# Patient Record
Sex: Female | Born: 1945 | Race: White | Hispanic: No | Marital: Married | Smoking: Current every day smoker
Health system: Southern US, Community
[De-identification: ages and names within clinical notes are randomized; demographics above are authoritative.]

## PROBLEM LIST (undated history)

## (undated) DIAGNOSIS — I10 Essential (primary) hypertension: Secondary | ICD-10-CM

## (undated) DIAGNOSIS — F0390 Unspecified dementia without behavioral disturbance: Secondary | ICD-10-CM

## (undated) DIAGNOSIS — E7800 Pure hypercholesterolemia, unspecified: Secondary | ICD-10-CM

## (undated) DIAGNOSIS — F32A Depression, unspecified: Secondary | ICD-10-CM

## (undated) DIAGNOSIS — F329 Major depressive disorder, single episode, unspecified: Secondary | ICD-10-CM

## (undated) DIAGNOSIS — I639 Cerebral infarction, unspecified: Secondary | ICD-10-CM

## (undated) DIAGNOSIS — K219 Gastro-esophageal reflux disease without esophagitis: Secondary | ICD-10-CM

---

## 2004-04-14 ENCOUNTER — Ambulatory Visit: Payer: Self-pay | Admitting: Unknown Physician Specialty

## 2004-05-06 ENCOUNTER — Ambulatory Visit: Payer: Self-pay | Admitting: Obstetrics and Gynecology

## 2004-08-17 ENCOUNTER — Inpatient Hospital Stay: Payer: Self-pay | Admitting: Urology

## 2004-08-18 ENCOUNTER — Other Ambulatory Visit: Payer: Self-pay

## 2004-08-24 ENCOUNTER — Ambulatory Visit: Payer: Self-pay | Admitting: Urology

## 2004-08-24 ENCOUNTER — Other Ambulatory Visit: Payer: Self-pay

## 2004-08-25 ENCOUNTER — Ambulatory Visit: Payer: Self-pay | Admitting: Urology

## 2006-02-19 ENCOUNTER — Emergency Department (HOSPITAL_COMMUNITY): Admission: EM | Admit: 2006-02-19 | Discharge: 2006-02-19 | Payer: Self-pay | Admitting: Emergency Medicine

## 2006-05-01 ENCOUNTER — Ambulatory Visit: Payer: Self-pay

## 2006-11-13 ENCOUNTER — Ambulatory Visit: Payer: Self-pay | Admitting: Internal Medicine

## 2006-11-30 ENCOUNTER — Ambulatory Visit: Payer: Self-pay | Admitting: Internal Medicine

## 2006-12-20 ENCOUNTER — Ambulatory Visit: Payer: Self-pay | Admitting: Internal Medicine

## 2007-01-04 ENCOUNTER — Emergency Department: Payer: Self-pay | Admitting: Unknown Physician Specialty

## 2007-01-06 ENCOUNTER — Emergency Department (HOSPITAL_COMMUNITY): Admission: EM | Admit: 2007-01-06 | Discharge: 2007-01-06 | Payer: Self-pay | Admitting: Emergency Medicine

## 2007-01-22 ENCOUNTER — Ambulatory Visit: Payer: Self-pay | Admitting: Internal Medicine

## 2007-07-05 ENCOUNTER — Ambulatory Visit: Payer: Self-pay | Admitting: Unknown Physician Specialty

## 2007-11-12 ENCOUNTER — Ambulatory Visit: Payer: Self-pay | Admitting: Internal Medicine

## 2007-12-24 ENCOUNTER — Ambulatory Visit: Payer: Self-pay | Admitting: Internal Medicine

## 2008-04-10 ENCOUNTER — Ambulatory Visit: Payer: Self-pay | Admitting: Internal Medicine

## 2008-05-02 ENCOUNTER — Emergency Department: Payer: Self-pay | Admitting: Internal Medicine

## 2008-05-02 ENCOUNTER — Emergency Department (HOSPITAL_COMMUNITY): Admission: EM | Admit: 2008-05-02 | Discharge: 2008-05-02 | Payer: Self-pay | Admitting: Emergency Medicine

## 2008-05-08 ENCOUNTER — Ambulatory Visit: Payer: Self-pay | Admitting: Internal Medicine

## 2008-05-12 ENCOUNTER — Ambulatory Visit: Payer: Self-pay | Admitting: Vascular Surgery

## 2008-05-21 ENCOUNTER — Encounter: Payer: Self-pay | Admitting: Internal Medicine

## 2008-05-28 ENCOUNTER — Inpatient Hospital Stay: Payer: Self-pay | Admitting: Vascular Surgery

## 2008-06-20 ENCOUNTER — Encounter: Payer: Self-pay | Admitting: Internal Medicine

## 2008-12-01 ENCOUNTER — Ambulatory Visit: Payer: Self-pay | Admitting: Nephrology

## 2009-02-12 ENCOUNTER — Emergency Department (HOSPITAL_COMMUNITY): Admission: EM | Admit: 2009-02-12 | Discharge: 2009-02-12 | Payer: Self-pay | Admitting: Emergency Medicine

## 2009-05-12 ENCOUNTER — Inpatient Hospital Stay: Payer: Self-pay | Admitting: Internal Medicine

## 2009-06-16 ENCOUNTER — Ambulatory Visit: Payer: Self-pay | Admitting: Family Medicine

## 2010-05-23 LAB — POCT I-STAT, CHEM 8
BUN: 30 mg/dL — ABNORMAL HIGH (ref 6–23)
Calcium, Ion: 1.19 mmol/L (ref 1.12–1.32)
Calcium, Ion: 1.27 mmol/L (ref 1.12–1.32)
Chloride: 112 mEq/L (ref 96–112)
Chloride: 115 mEq/L — ABNORMAL HIGH (ref 96–112)
Creatinine, Ser: 1.9 mg/dL — ABNORMAL HIGH (ref 0.4–1.2)
Glucose, Bld: 146 mg/dL — ABNORMAL HIGH (ref 70–99)
Glucose, Bld: 96 mg/dL (ref 70–99)
HCT: 47 % — ABNORMAL HIGH (ref 36.0–46.0)
Hemoglobin: 16 g/dL — ABNORMAL HIGH (ref 12.0–15.0)
TCO2: 21 mmol/L (ref 0–100)
TCO2: 25 mmol/L (ref 0–100)

## 2010-05-23 LAB — POCT CARDIAC MARKERS: Troponin i, poc: <0.05 ng/mL (ref 0.00–0.09)

## 2010-06-01 ENCOUNTER — Inpatient Hospital Stay: Payer: Self-pay | Admitting: Internal Medicine

## 2010-06-02 LAB — COMPREHENSIVE METABOLIC PANEL
Albumin: 4.1 g/dL (ref 3.5–5.2)
Alkaline Phosphatase: 94 U/L (ref 39–117)
BUN: 16 mg/dL (ref 6–23)
Creatinine, Ser: 1.11 mg/dL (ref 0.4–1.2)
Glucose, Bld: 248 mg/dL — ABNORMAL HIGH (ref 70–99)
Potassium: 3.7 mEq/L (ref 3.5–5.1)
Total Bilirubin: 0.5 mg/dL (ref 0.3–1.2)
Total Protein: 7.2 g/dL (ref 6.0–8.3)

## 2010-06-02 LAB — DIFFERENTIAL
Basophils Absolute: 0.1 10*3/uL (ref 0.0–0.1)
Basophils Relative: 1 % (ref 0–1)
Lymphocytes Relative: 29 % (ref 12–46)
Monocytes Relative: 4 % (ref 3–12)
Neutro Abs: 4.9 10*3/uL (ref 1.7–7.7)
Neutrophils Relative %: 64 % (ref 43–77)

## 2010-06-02 LAB — URINALYSIS, ROUTINE W REFLEX MICROSCOPIC
Bilirubin Urine: NEGATIVE
Glucose, UA: 250 mg/dL — AB
Hgb urine dipstick: NEGATIVE
Ketones, ur: NEGATIVE mg/dL
Protein, ur: NEGATIVE mg/dL
pH: 6.5 (ref 5.0–8.0)

## 2010-06-02 LAB — PROTIME-INR
INR: 1 (ref 0.00–1.49)
Prothrombin Time: 13.7 seconds (ref 11.6–15.2)

## 2010-06-02 LAB — CBC
HCT: 39.5 % (ref 36.0–46.0)
Hemoglobin: 13.8 g/dL (ref 12.0–15.0)
MCV: 87.8 fL (ref 78.0–100.0)
Platelets: 242 10*3/uL (ref 150–400)
RDW: 12.8 % (ref 11.5–15.5)

## 2010-06-02 LAB — APTT: aPTT: 25 seconds (ref 24–37)

## 2010-06-20 ENCOUNTER — Ambulatory Visit: Payer: Self-pay | Admitting: Family Medicine

## 2010-07-05 DIAGNOSIS — R7989 Other specified abnormal findings of blood chemistry: Secondary | ICD-10-CM

## 2010-07-05 DIAGNOSIS — I2789 Other specified pulmonary heart diseases: Secondary | ICD-10-CM

## 2010-11-29 ENCOUNTER — Emergency Department (HOSPITAL_COMMUNITY): Payer: Medicare PPO

## 2010-11-29 ENCOUNTER — Encounter: Payer: Self-pay | Admitting: Emergency Medicine

## 2010-11-29 ENCOUNTER — Emergency Department (HOSPITAL_COMMUNITY)
Admission: EM | Admit: 2010-11-29 | Discharge: 2010-11-29 | Disposition: A | Discharge status: Home / Self Care | Payer: Medicare PPO | Attending: Emergency Medicine | Admitting: Emergency Medicine

## 2010-11-29 ENCOUNTER — Other Ambulatory Visit: Payer: Self-pay

## 2010-11-29 DIAGNOSIS — Z79899 Other long term (current) drug therapy: Secondary | ICD-10-CM | POA: Insufficient documentation

## 2010-11-29 DIAGNOSIS — R55 Syncope and collapse: Secondary | ICD-10-CM | POA: Insufficient documentation

## 2010-11-29 DIAGNOSIS — R197 Diarrhea, unspecified: Secondary | ICD-10-CM | POA: Insufficient documentation

## 2010-11-29 DIAGNOSIS — F0390 Unspecified dementia without behavioral disturbance: Secondary | ICD-10-CM | POA: Insufficient documentation

## 2010-11-29 DIAGNOSIS — R0600 Dyspnea, unspecified: Secondary | ICD-10-CM

## 2010-11-29 DIAGNOSIS — R0989 Other specified symptoms and signs involving the circulatory and respiratory systems: Secondary | ICD-10-CM | POA: Insufficient documentation

## 2010-11-29 DIAGNOSIS — Z86711 Personal history of pulmonary embolism: Secondary | ICD-10-CM | POA: Insufficient documentation

## 2010-11-29 DIAGNOSIS — R011 Cardiac murmur, unspecified: Secondary | ICD-10-CM | POA: Insufficient documentation

## 2010-11-29 DIAGNOSIS — R0609 Other forms of dyspnea: Secondary | ICD-10-CM | POA: Insufficient documentation

## 2010-11-29 DIAGNOSIS — K769 Liver disease, unspecified: Secondary | ICD-10-CM | POA: Insufficient documentation

## 2010-11-29 DIAGNOSIS — R51 Headache: Secondary | ICD-10-CM | POA: Insufficient documentation

## 2010-11-29 HISTORY — DX: Essential (primary) hypertension: I10

## 2010-11-29 HISTORY — DX: Depression, unspecified: F32.A

## 2010-11-29 HISTORY — DX: Gastro-esophageal reflux disease without esophagitis: K21.9

## 2010-11-29 HISTORY — DX: Pure hypercholesterolemia, unspecified: E78.00

## 2010-11-29 HISTORY — DX: Major depressive disorder, single episode, unspecified: F32.9

## 2010-11-29 HISTORY — DX: Unspecified dementia, unspecified severity, without behavioral disturbance, psychotic disturbance, mood disturbance, and anxiety: F03.90

## 2010-11-29 HISTORY — DX: Cerebral infarction, unspecified: I63.9

## 2010-11-29 LAB — COMPREHENSIVE METABOLIC PANEL
AST: 24 U/L (ref 0–37)
Albumin: 2.8 g/dL — ABNORMAL LOW (ref 3.5–5.2)
Alkaline Phosphatase: 104 U/L (ref 39–117)
BUN: 11 mg/dL (ref 6–23)
Chloride: 103 mEq/L (ref 96–112)
Potassium: 3.8 mEq/L (ref 3.5–5.1)
Total Bilirubin: 0.7 mg/dL (ref 0.3–1.2)

## 2010-11-29 LAB — URINALYSIS, ROUTINE W REFLEX MICROSCOPIC
Bilirubin Urine: NEGATIVE
Ketones, ur: NEGATIVE mg/dL
Nitrite: NEGATIVE
Nitrite: NEGATIVE
Protein, ur: 100 — AB
Urobilinogen, UA: 0.2 mg/dL (ref 0.0–1.0)
pH: 5

## 2010-11-29 LAB — BASIC METABOLIC PANEL
BUN: 19
CO2: 27
Chloride: 99
Glucose, Bld: 175 — ABNORMAL HIGH
Potassium: 3.8
Sodium: 134 — ABNORMAL LOW

## 2010-11-29 LAB — CBC
HCT: 47 — ABNORMAL HIGH
Hemoglobin: 16.2 — ABNORMAL HIGH
MCH: 29.7 pg (ref 26.0–34.0)
MCHC: 34.4
MCV: 89
MCV: 89.8 fL (ref 78.0–100.0)
Platelets: 219 10*3/uL (ref 150–400)
RDW: 12.7
RDW: 14.3 % (ref 11.5–15.5)
WBC: 14.9 10*3/uL — ABNORMAL HIGH (ref 4.0–10.5)

## 2010-11-29 LAB — DIFFERENTIAL
Basophils Absolute: 0
Basophils Relative: 0
Eosinophils Absolute: 0 — ABNORMAL LOW
Eosinophils Relative: 1
Lymphocytes Relative: 37
Monocytes Absolute: 0.4

## 2010-11-29 LAB — URINE MICROSCOPIC-ADD ON

## 2010-11-29 LAB — PROTIME-INR: Prothrombin Time: 45.5 seconds — ABNORMAL HIGH (ref 11.6–15.2)

## 2010-11-29 MED ORDER — IOHEXOL 350 MG/ML SOLN
100.0000 mL | Freq: Once | INTRAVENOUS | Status: AC | PRN
  Administered 2010-11-29: 100 mL via INTRAVENOUS

## 2010-11-29 MED ORDER — GADOBENATE DIMEGLUMINE 529 MG/ML IV SOLN
13.0000 mL | Freq: Once | INTRAVENOUS | Status: AC | PRN
  Administered 2010-11-29: 13 mL via INTRAVENOUS

## 2010-11-29 MED ORDER — NITROFURANTOIN MONOHYD MACRO 100 MG PO CAPS
100.0000 mg | ORAL_CAPSULE | Freq: Two times a day (BID) | ORAL | Status: DC
  Administered 2010-11-29: 100 mg via ORAL
  Filled 2010-11-29 (×4): qty 1

## 2010-11-29 MED ORDER — NITROFURANTOIN MONOHYD MACRO 100 MG PO CAPS
ORAL_CAPSULE | ORAL | Status: AC
  Filled 2010-11-29: qty 1

## 2010-11-29 NOTE — ED Provider Notes (Signed)
Scribed for Gerhard Munch, MD, the patient was seen in room APA02/APA02 . This chart was scribed by Ellie Lunch. This patient's care was started at 3:17 PM.   CSN: [id number] Arrival date & time: 11/29/2010  2:57 PM  Chief Complaint  Patient presents with  . Near Syncope  . Diarrhea    (Consider location/radiation/quality/duration/timing/severity/associated sxs/prior treatment) HPI Level 5 caveat for demientia Audrey Lopez is a 65 y.o. female with a history of dementia brought in by ambulance, who presents to the Emergency Department complaining of difficulty breathing beginning ~45 minutes ago. Pt reports being at baseline prior to sudden onset of SOB. Pt reports SOB is constant and has not improved since onset. Denies any modifying factors. Reports she has had a HA recently. Denies confusion, vision change, chest pain, abd pain, n/v, bowel change, numbness, weakness, or tingling in extremities, or ST. Pt reports she has been taking her medicine as prescribed.   Per ems pt c/o loose bowel movements and per husband pt was near passing out.  Past Medical History  Diagnosis Date  . Hypercholesteremia   . Depression   . Stroke   . Dementia   . Hypertension   . GERD (gastroesophageal reflux disease)   blood clot  History reviewed. No pertinent past surgical history.  History reviewed. No pertinent family history.  History  Substance Use Topics  . Smoking status: Not on file  . Smokeless tobacco: Not on file  . Alcohol Use:   Lives with husband  Review of Systems  Unable to perform ROS: Dementia  Constitutional: Negative for fever and chills.  HENT: Negative for sore throat and trouble swallowing.   Eyes: Negative for visual disturbance.  Respiratory: Positive for shortness of breath.   Cardiovascular: Negative for leg swelling.  Gastrointestinal: Negative for nausea, vomiting, abdominal pain, diarrhea and constipation.  Musculoskeletal: Negative for back pain.    Neurological: Positive for headaches. Negative for dizziness and weakness.  All other systems reviewed and are negative.    Allergies  Motrin  Home Medications  No current outpatient prescriptions on file.  BP 128/78  Temp(Src) 100 F (37.8 C) (Oral)  Resp 22  Ht 5\' 5"  (1.651 m)  Wt 140 lb (63.504 kg)  BMI 23.30 kg/m2  SpO2 89%  Physical Exam  Nursing note and vitals reviewed. Constitutional: She appears well-developed and well-nourished.  HENT:  Head: Normocephalic and atraumatic.  Eyes: Conjunctivae are normal. Pupils are equal, round, and reactive to light.  Neck: Neck supple.  Cardiovascular:  Murmur heard.      Tachycardic slight murmur 1/6  Pulmonary/Chest: Effort normal and breath sounds normal.  Abdominal: Soft. There is no tenderness.       No cva tenderness   Musculoskeletal: She exhibits no edema and no tenderness.  Neurological: She is alert.  Skin: Skin is warm and dry.  Psychiatric: She has a normal mood and affect.   Procedures (including critical care time)  OTHER DATA REVIEWED: Nursing notes, vital signs, and past medical records reviewed.  DIAGNOSTIC STUDIES: Oxygen Saturation is 89% on 2L/minute via nasal canula , low by my interpretation.    LABS / RADIOLOGY:  Labs Reviewed  COMPREHENSIVE METABOLIC PANEL - Abnormal; Notable for the following:    Glucose, Bld 166 (*)    Albumin 2.8 (*)    ALT 36 (*)    GFR calc non Af Amer 63 (*)    GFR calc Af Amer 73 (*)    All other components within normal limits  CBC - Abnormal; Notable for the following:    WBC 14.9 (*)    All other components within normal limits  URINALYSIS, ROUTINE W REFLEX MICROSCOPIC - Abnormal; Notable for the following:    Glucose, UA 100 (*)    Hgb urine dipstick SMALL (*)    Protein, ur 30 (*)    Leukocytes, UA SMALL (*)    All other components within normal limits  PROTIME-INR - Abnormal; Notable for the following:    Prothrombin Time 45.5 (*)    INR 4.78 (*)     All other components within normal limits  URINE MICROSCOPIC-ADD ON - Abnormal; Notable for the following:    Bacteria, UA MANY (*)    All other components within normal limits  POCT I-STAT TROPONIN I  I-STAT TROPONIN I   Dg Chest 1 View  11/29/2010  *RADIOLOGY REPORT*  Clinical Data: Short of breath  CHEST - 1 VIEW  Comparison: 05/02/2008  Findings: Heart size is upper normal.  Negative for heart failure. Negative for pneumonia.  Mild linear atelectasis in the lung bases. No pleural effusion.  IMPRESSION: Mild bibasilar atelectasis.  Original Report Authenticated By: Camelia Phenes, M.D.   Ct Head Wo Contrast  11/29/2010  *RADIOLOGY REPORT*  Clinical Data: Near syncope  CT HEAD WITHOUT CONTRAST  Technique:  Contiguous axial images were obtained from the base of the skull through the vertex without contrast.  Comparison: CT 05/02/2008  Findings: Advanced atrophy, with progression from the prior study. Chronic ischemic changes have progressed in the left frontal left parietal lobes.  Chronic right parietal infarct is unchanged. Negative for acute infarct.  Negative for hemorrhage or mass. Calvarium is intact.  IMPRESSION: Progression of atrophy and chronic ischemic change.  No acute abnormality.  Original Report Authenticated By: Camelia Phenes, M.D.   Ct Angio Chest W/cm &/or Wo Cm  11/29/2010  *RADIOLOGY REPORT*  Clinical Data:  Syncope, shortness of breath, tachycardia, history of pulmonary embolism, hypertension, dementia, stroke, MRI  CT ANGIOGRAPHY CHEST WITH CONTRAST  Technique:  Multidetector CT imaging of the chest was performed using the standard protocol during bolus administration of intravenous contrast.  Multiplanar CT image reconstructions including MIPs were obtained to evaluate the vascular anatomy.  Contrast: OMNIPAQUE IOHEXOL 350 MG/ML IV SOLN  Comparison:  None  Findings: Aorta normal caliber without aneurysm or dissection. Scattered coronary arterial calcifications. Somewhat  linear filling defect identified within a left lower lobe pulmonary artery, series 9, images 123 - 137. This could represent an acute linear pulmonary embolus or a synechia as result of prior pulmonary embolism. No additional pulmonary emboli identified. No thoracic adenopathy. Scattered areas of atelectasis dependently in both lower lobes as well as in the right middle lobe.  Remaining lungs clear. Minimal peribronchial thickening noted. No pleural effusion or pneumothorax. No acute osseous findings. Upper abdomen significant for areas of low attenuation within the liver, predominately in the midportion of liver adjacent the gallbladder fossa, extending to the hepatic surfaces well as deep to the caudate lobe. It is uncertain whether these represent sequela of vascular abnormality, geographic fat distribution, or low attenuation mass.  Review of the MIP images confirms the above findings.  IMPRESSION: Single somewhat linear filling defect identified within a left lower lobe pulmonary artery compatible with age indeterminate pulmonary embolism. Bibasilar atelectasis. Patchy areas of low attenuation within the liver, all in the midportion of liver extending deep to the caudate lobe; uncertain whether this represents sequela of avascular event, mass, or  geographic fat. Follow-up non emergent multi phase MR imaging of the abdomen with and without contrast recommended to characterize.  Critical Value/emergent results were called by telephone at the time of interpretation on 11/29/2010 at 1725 hrs to Dr. Jeraldine Loots, who verbally acknowledged these results.  Original Report Authenticated By: Lollie Marrow, M.D.    ED COURSE / COORDINATION OF CARE: Following the initial eval of the patient, her daughter arrived and provided the additional historical detail that the patient has been falling more frequently, including possible head contact.  A head CT was added to her eval.  MDM: This 65 year old female with history of  dementia and prior PE now presents with shortness of breath.  The patient's family also notes that patient has been having increasing falls recently. Her physical exam is notable initially, for tachycardia, no distress, no unusual breath sounds. Her labs notable for a supra-therapeutic INR, as well as urinary tract infection and leukocytosis. Renographic studies are notable for a CT PE protocol finding of left pulmonary artery lesion, and multiple hepatic lesions which are not fully characterized, and does require additional evaluation. The patient's case was discussed with her primary care physician Dr. MI LES, will be available to follow up with the patient. With her the necessity of additional evaluation of the liver, the patient underwent an MRI here. The expectation is that this study will be read tomorrow morning, and the results were faxed to Dr. Marvis Moeller, who is awaiting the results. All the results, disposition thoughts, and the requirement for additional evaluation were discussed with the patient and her husband.  Both he, and she noted that the patient would be more comfortable going home to await the MRI results. With the patient's decreased tachycardia, decreased respiratory rate, and increasing comfort, in addition to the absence of acute findings, this wish was accommodated. The patient has been advised to hold tonight's and tomorrow's Coumadin dose, then followup as directed with Dr. Marvis Moeller.   MEDICATIONS GIVEN IN THE E.D.  Medications  iohexol (OMNIPAQUE) 350 MG/ML injection 100 mL (100 mL Intravenous Contrast Given 11/29/10 1709)    SCRIBE ATTESTATION: I personally performed the services described in this documentation, which was scribed in my presence. The recorded information has been reviewed and considered.           Gerhard Munch, MD 11/29/10 1925

## 2010-11-29 NOTE — ED Notes (Signed)
Pt left er stating no needs with disc of imaging

## 2010-11-29 NOTE — ED Notes (Signed)
Swallow stroke screen was done at 1730

## 2010-11-29 NOTE — ED Notes (Signed)
Per ems pt c/o loose bowel movements and per husband pt was near passing out.

## 2010-11-29 NOTE — ED Notes (Signed)
Per EDP request pt was given a gingerale. Pt drank fluids with no difficulty.

## 2011-01-15 ENCOUNTER — Emergency Department (HOSPITAL_COMMUNITY)
Admission: EM | Admit: 2011-01-15 | Discharge: 2011-01-15 | Disposition: A | Discharge status: Home / Self Care | Payer: 59 | Attending: Emergency Medicine | Admitting: Emergency Medicine

## 2011-01-15 ENCOUNTER — Encounter (HOSPITAL_COMMUNITY): Payer: Self-pay | Admitting: Emergency Medicine

## 2011-01-15 ENCOUNTER — Emergency Department (HOSPITAL_COMMUNITY): Payer: 59

## 2011-01-15 DIAGNOSIS — F3289 Other specified depressive episodes: Secondary | ICD-10-CM | POA: Insufficient documentation

## 2011-01-15 DIAGNOSIS — Z7901 Long term (current) use of anticoagulants: Secondary | ICD-10-CM | POA: Insufficient documentation

## 2011-01-15 DIAGNOSIS — I10 Essential (primary) hypertension: Secondary | ICD-10-CM | POA: Insufficient documentation

## 2011-01-15 DIAGNOSIS — N390 Urinary tract infection, site not specified: Secondary | ICD-10-CM | POA: Insufficient documentation

## 2011-01-15 DIAGNOSIS — R000 Tachycardia, unspecified: Secondary | ICD-10-CM | POA: Insufficient documentation

## 2011-01-15 DIAGNOSIS — F0390 Unspecified dementia without behavioral disturbance: Secondary | ICD-10-CM | POA: Insufficient documentation

## 2011-01-15 DIAGNOSIS — Z794 Long term (current) use of insulin: Secondary | ICD-10-CM | POA: Insufficient documentation

## 2011-01-15 DIAGNOSIS — R112 Nausea with vomiting, unspecified: Secondary | ICD-10-CM | POA: Insufficient documentation

## 2011-01-15 DIAGNOSIS — F329 Major depressive disorder, single episode, unspecified: Secondary | ICD-10-CM | POA: Insufficient documentation

## 2011-01-15 DIAGNOSIS — Z8673 Personal history of transient ischemic attack (TIA), and cerebral infarction without residual deficits: Secondary | ICD-10-CM | POA: Insufficient documentation

## 2011-01-15 DIAGNOSIS — E7800 Pure hypercholesterolemia, unspecified: Secondary | ICD-10-CM | POA: Insufficient documentation

## 2011-01-15 DIAGNOSIS — K219 Gastro-esophageal reflux disease without esophagitis: Secondary | ICD-10-CM | POA: Insufficient documentation

## 2011-01-15 LAB — URINALYSIS, ROUTINE W REFLEX MICROSCOPIC
Glucose, UA: NEGATIVE mg/dL
pH: 6 (ref 5.0–8.0)

## 2011-01-15 LAB — CBC
HCT: 38.2 % (ref 36.0–46.0)
RBC: 4.28 MIL/uL (ref 3.87–5.11)
RDW: 14 % (ref 11.5–15.5)
WBC: 15.1 10*3/uL — ABNORMAL HIGH (ref 4.0–10.5)

## 2011-01-15 LAB — DIFFERENTIAL
Basophils Absolute: 0 10*3/uL (ref 0.0–0.1)
Lymphocytes Relative: 13 % (ref 12–46)
Lymphs Abs: 2 10*3/uL (ref 0.7–4.0)
Monocytes Absolute: 1.5 10*3/uL — ABNORMAL HIGH (ref 0.1–1.0)
Neutro Abs: 11.6 10*3/uL — ABNORMAL HIGH (ref 1.7–7.7)

## 2011-01-15 LAB — BASIC METABOLIC PANEL
CO2: 25 mEq/L (ref 19–32)
Chloride: 98 mEq/L (ref 96–112)
Creatinine, Ser: 1.28 mg/dL — ABNORMAL HIGH (ref 0.50–1.10)
Sodium: 132 mEq/L — ABNORMAL LOW (ref 135–145)

## 2011-01-15 LAB — URINE MICROSCOPIC-ADD ON

## 2011-01-15 MED ORDER — SODIUM CHLORIDE 0.9 % IV BOLUS (SEPSIS)
500.0000 mL | Freq: Once | INTRAVENOUS | Status: AC
  Administered 2011-01-15: 500 mL via INTRAVENOUS

## 2011-01-15 MED ORDER — ONDANSETRON HCL 4 MG/2ML IJ SOLN
4.0000 mg | Freq: Once | INTRAMUSCULAR | Status: AC
  Administered 2011-01-15: 4 mg via INTRAVENOUS
  Filled 2011-01-15: qty 2

## 2011-01-15 MED ORDER — AMOXICILLIN 500 MG PO CAPS: 500.0000 mg | ORAL_CAPSULE | Freq: Two times a day (BID) | ORAL | Status: AC

## 2011-01-15 MED ORDER — ONDANSETRON 4 MG PO TBDP: 4.0000 mg | ORAL_TABLET | Freq: Three times a day (TID) | ORAL | Status: AC | PRN

## 2011-01-15 MED ORDER — CIPROFLOXACIN HCL 250 MG PO TABS
500.0000 mg | ORAL_TABLET | Freq: Once | ORAL | Status: AC
  Administered 2011-01-15: 500 mg via ORAL
  Filled 2011-01-15: qty 2

## 2011-01-15 NOTE — ED Notes (Signed)
Patient complaining of nausea and vomiting x 3 days. No complaints of pain at this time. Also reports minimal urine output for 1 1/2 days.

## 2011-01-16 NOTE — ED Provider Notes (Signed)
History     CSN: [id number] Arrival date & time: 01/15/2011  7:14 PM   First MD Initiated Contact with Patient 01/15/11 1918      Chief Complaint  Patient presents with  . Emesis    (Consider location/radiation/quality/duration/timing/severity/associated sxs/prior treatment) HPI Comments: Level 5 caveat due to dementia Audrey Lopez is a 65 y.o. female  With a history of hypertension, GERD, depression, stroke and dementia brought to the Emergency Department by her husband complaining of nausea and vomiting for three days. Per husband her appetite has been poor for two days and she ha c/o pain with urination. He is no aware of a fever, diarrhea. He ha not given her any medicines except her prescriptions since the illness began.   Patient is a 65 y.o. female presenting with vomiting.  Emesis     Past Medical History  Diagnosis Date  . Hypercholesteremia   . Depression   . Stroke   . Dementia   . Hypertension   . GERD (gastroesophageal reflux disease)     History reviewed. No pertinent past surgical history.  History reviewed. No pertinent family history.  History  Substance Use Topics  . Smoking status: Current Everyday Smoker -- 1.0 packs/day    Types: Cigarettes  . Smokeless tobacco: Not on file  . Alcohol Use: No    OB History    Grav Para Term Preterm Abortions TAB SAB Ect Mult Living                  Review of Systems  Unable to perform ROS: Dementia  Gastrointestinal: Positive for vomiting.    Allergies  Motrin  Home Medications   Current Outpatient Rx  Name Route Sig Dispense Refill  . AMITRIPTYLINE HCL 25 MG PO TABS Oral Take 25 mg by mouth at bedtime.      Marland Kitchen CARVEDILOL 12.5 MG PO TABS Oral Take 12.5 mg by mouth 2 (two) times daily with a meal.      . DONEPEZIL HCL 10 MG PO TABS Oral Take 10 mg by mouth at bedtime as needed. Memory loss     . INSULIN GLARGINE 100 UNIT/ML [place]zel SOLN Subcutaneous Inject 28-30 Units into the skin at bedtime.     .  INSULIN LISPRO (HUMAN) 100 UNIT/ML [place] SOLN Subcutaneous Inject 10 Units into the skin 3 (three) times daily before meals. If sugar is over 130 add 10 units as base and for each additional 50 add 2 units     . LOVASTATIN 40 MG PO TABS Oral Take 40 mg by mouth at bedtime.      Marland Kitchen MEMANTINE HCL 10 MG PO TABS Oral Take 10 mg by mouth 2 (two) times daily.      Marland Kitchen NIACIN 500 MG PO TABS Oral Take 1,000 mg by mouth 2 (two) times daily with a meal.     . OMEPRAZOLE 20 MG PO CPDR Oral Take 20 mg by mouth daily.      . SERTRALINE HCL 25 MG PO TABS Oral Take 25 mg by mouth daily.      . WARFARIN SODIUM 3 MG PO TABS Oral Take 6 mg by mouth daily. On Mon, and Sat. Patient takes 2 tablets(3mg )=6mg     . WARFARIN SODIUM 4 MG PO TABS Oral Take 4 mg by mouth daily. Patient takes 4 mg on Sun,Tues.,Wed.,Thur.,Fri.    . AMOXICILLIN 500 MG PO CAPS Oral Take 1 capsule (500 mg total) by mouth 2 (two) times daily. 14 capsule 0  .  ONDANSETRON 4 MG PO TBDP Oral Take 1 tablet (4 mg total) by mouth every 8 (eight) hours as needed for nausea. 20 tablet 0    BP 142/71  Pulse 115  Temp(Src) 100.2 F (37.9 C) (Oral)  Resp 20  Ht 5\' 5"  (1.651 m)  Wt 140 lb (63.504 kg)  BMI 23.30 kg/m2  SpO2 94%  Physical Exam  Nursing note and vitals reviewed. Constitutional: She appears well-developed and well-nourished. No distress.  HENT:  Head: Normocephalic and atraumatic.  Eyes: EOM are normal.  Neck: Normal range of motion.  Cardiovascular:       tachycardia  Pulmonary/Chest: Effort normal and breath sounds normal.  Abdominal: Soft. Bowel sounds are normal. She exhibits no distension and no mass. There is no tenderness. There is no rebound and no guarding.  Musculoskeletal: Normal range of motion.  Neurological: She is alert.       Oriented to person and place.    ED Course  Procedures (including critical care time)  Labs Reviewed  CBC - Abnormal; Notable for the following:    WBC 15.1 (*)    All other components  within normal limits  DIFFERENTIAL - Abnormal; Notable for the following:    Neutro Abs 11.6 (*)    Monocytes Absolute 1.5 (*)    All other components within normal limits  BASIC METABOLIC PANEL - Abnormal; Notable for the following:    Sodium 132 (*)    Glucose, Bld 138 (*)    Creatinine, Ser 1.28 (*)    GFR calc non Af Amer 44 (*)    GFR calc Af Amer 50 (*)    All other components within normal limits  URINALYSIS, ROUTINE W REFLEX MICROSCOPIC - Abnormal; Notable for the following:    Hgb urine dipstick SMALL (*)    Nitrite POSITIVE (*)    Leukocytes, UA MODERATE (*)    All other components within normal limits  URINE MICROSCOPIC-ADD ON - Abnormal; Notable for the following:    Bacteria, UA MANY (*)    All other components within normal limits  URINE CULTURE   Dg Abd Acute W/chest  01/15/2011  *RADIOLOGY REPORT*  Clinical Data: Vomiting.  Nausea.  History of stroke.  ACUTE ABDOMEN SERIES (ABDOMEN 2 VIEW & CHEST 1 VIEW)  Comparison: 11/29/2010.  Findings: Suboptimal positioning.  Chest radiograph.  Bilateral basilar atelectasis.  Streaky opacity in the retrocardiac region likely represents atelectasis.   No free air underneath the hemidiaphragms.  Normal bowel gas pattern.  No dilated loops of large or small bowel.  Stool and bowel gas extends to the rectosigmoid.L5-S1 posterior lumbar interbody fusion.  IMPRESSION: No acute abnormality.  Nonobstructive bowel gas pattern.  Mild basilar atelectasis in the lungs.  Original Report Authenticated By: Andreas Newport, M.D.     1. Urinary tract infection   2. Nausea and vomiting     The patient appears reasonably screened and/or stabilized for discharge and I doubt any other medical condition or other [facility] requiring further screening, evaluation, or treatment in the ED at this time prior to discharge.  MDM  Patient with nausea and vomiting x 3 days. IVF and antiemetics given. Patient able to take PO fluids in the ER. Up to the bathroom with  assistance. No vomiting after arrival in the ER.Labs support urinary tract infection. Acute abdominal series unremarkable.Pt stable in ED with no significant deterioration in condition.  MDM Reviewed: previous chart, nursing note and vitals Reviewed previous: labs and x-ray Interpretation: labs and x-ray  Nicoletta Dress. Colon Branch, MD 01/16/11 2226

## 2011-01-18 LAB — URINE CULTURE

## 2011-01-19 NOTE — ED Notes (Signed)
Patient was treated with amox-sensitive to same-chart appended per protocol MD

## 2011-03-16 ENCOUNTER — Ambulatory Visit: Payer: Self-pay | Admitting: Internal Medicine

## 2011-03-23 ENCOUNTER — Ambulatory Visit: Payer: Self-pay | Admitting: Internal Medicine

## 2011-03-24 ENCOUNTER — Ambulatory Visit: Payer: Self-pay | Admitting: Internal Medicine

## 2011-03-24 LAB — CEA: CEA: 3.9 ng/mL (ref 0.0–4.7)

## 2011-04-20 LAB — CBC CANCER CENTER
Eosinophil %: 9 %
Lymphocyte #: 3 x10 3/mm (ref 1.0–3.6)
Lymphocyte %: 32.9 %
MCV: 82 fL (ref 80–100)
Monocyte %: 6.7 %
Neutrophil %: 50.6 %
Platelet: 246 x10 3/mm (ref 150–440)
RBC: 4.95 10*6/uL (ref 3.80–5.20)
WBC: 9.2 x10 3/mm (ref 3.6–11.0)

## 2011-04-20 LAB — CREATININE, SERUM: Creatinine: 1.41 mg/dL — ABNORMAL HIGH (ref 0.60–1.30)

## 2011-04-20 LAB — HEPATIC FUNCTION PANEL A (ARMC)
Bilirubin, Direct: 0.1 mg/dL (ref 0.00–0.20)
Bilirubin,Total: 0.3 mg/dL (ref 0.2–1.0)
SGPT (ALT): 36 U/L
Total Protein: 7.8 g/dL (ref 6.4–8.2)

## 2011-04-21 ENCOUNTER — Ambulatory Visit: Payer: Self-pay | Admitting: Internal Medicine

## 2012-07-23 ENCOUNTER — Ambulatory Visit: Payer: Self-pay | Admitting: Family Medicine

## 2012-08-26 ENCOUNTER — Ambulatory Visit: Payer: Self-pay | Admitting: Surgery

## 2012-10-23 ENCOUNTER — Emergency Department: Payer: Self-pay | Admitting: Emergency Medicine

## 2012-10-23 LAB — COMPREHENSIVE METABOLIC PANEL
Albumin: 3.2 g/dL — ABNORMAL LOW (ref 3.4–5.0)
Anion Gap: 3 — ABNORMAL LOW (ref 7–16)
BUN: 12 mg/dL (ref 7–18)
EGFR (African American): 48 — ABNORMAL LOW
Glucose: 127 mg/dL — ABNORMAL HIGH (ref 65–99)
Osmolality: 275 (ref 275–301)
Total Protein: 7 g/dL (ref 6.4–8.2)

## 2012-10-23 LAB — CBC
HCT: 48.2 % — ABNORMAL HIGH (ref 35.0–47.0)
Platelet: 238 10*3/uL (ref 150–440)
RBC: 5.38 10*6/uL — ABNORMAL HIGH (ref 3.80–5.20)

## 2012-10-23 LAB — URINALYSIS, COMPLETE
Glucose,UR: NEGATIVE mg/dL (ref 0–75)
Ketone: NEGATIVE
Ph: 6 (ref 4.5–8.0)
WBC UR: 3 /HPF (ref 0–5)

## 2012-10-23 LAB — TROPONIN I: Troponin-I: <0.02 ng/mL

## 2012-10-23 LAB — LIPASE, BLOOD: Lipase: 173 U/L (ref 73–393)

## 2013-02-11 ENCOUNTER — Ambulatory Visit: Payer: Self-pay | Admitting: Family Medicine

## 2013-04-04 ENCOUNTER — Observation Stay: Payer: Self-pay | Admitting: Internal Medicine

## 2013-04-04 LAB — COMPREHENSIVE METABOLIC PANEL
ALBUMIN: 3.3 g/dL — AB (ref 3.4–5.0)
ANION GAP: 5 — AB (ref 7–16)
Alkaline Phosphatase: 150 U/L — ABNORMAL HIGH
BILIRUBIN TOTAL: 0.4 mg/dL (ref 0.2–1.0)
BUN: 14 mg/dL (ref 7–18)
CALCIUM: 9.3 mg/dL (ref 8.5–10.1)
Chloride: 106 mmol/L (ref 98–107)
Co2: 29 mmol/L (ref 21–32)
Creatinine: 1.24 mg/dL (ref 0.60–1.30)
EGFR (African American): 53 — ABNORMAL LOW
EGFR (Non-African Amer.): 46 — ABNORMAL LOW
Glucose: 115 mg/dL — ABNORMAL HIGH (ref 65–99)
Osmolality: 281 (ref 275–301)
Potassium: 4.4 mmol/L (ref 3.5–5.1)
SGOT(AST): 24 U/L (ref 15–37)
SGPT (ALT): 40 U/L (ref 12–78)
Sodium: 140 mmol/L (ref 136–145)
TOTAL PROTEIN: 7 g/dL (ref 6.4–8.2)

## 2013-04-04 LAB — CBC
HCT: 49 % — AB (ref 35.0–47.0)
HGB: 16.4 g/dL — ABNORMAL HIGH (ref 12.0–16.0)
MCH: 30.1 pg (ref 26.0–34.0)
MCHC: 33.4 g/dL (ref 32.0–36.0)
MCV: 90 fL (ref 80–100)
Platelet: 232 10*3/uL (ref 150–440)
RBC: 5.44 10*6/uL — ABNORMAL HIGH (ref 3.80–5.20)
RDW: 14.2 % (ref 11.5–14.5)
WBC: 8.7 10*3/uL (ref 3.6–11.0)

## 2013-04-04 LAB — PROTIME-INR
INR: 2.1
PROTHROMBIN TIME: 22.9 s — AB (ref 11.5–14.7)

## 2013-04-04 LAB — CK-MB: CK-MB: 0.7 ng/mL (ref 0.5–3.6)

## 2013-04-04 LAB — TROPONIN I: Troponin-I: <0.02 ng/mL

## 2013-04-05 LAB — LIPID PANEL
CHOLESTEROL: 149 mg/dL (ref 0–200)
HDL Cholesterol: 23 mg/dL — ABNORMAL LOW (ref 40–60)
LDL CHOLESTEROL, CALC: 78 mg/dL (ref 0–100)
TRIGLYCERIDES: 239 mg/dL — AB (ref 0–200)
VLDL Cholesterol, Calc: 48 mg/dL — ABNORMAL HIGH (ref 5–40)

## 2013-04-05 LAB — CK-MB
CK-MB: 1 ng/mL (ref 0.5–3.6)
CK-MB: 1 ng/mL (ref 0.5–3.6)

## 2013-04-05 LAB — TROPONIN I
Troponin-I: <0.02 ng/mL
Troponin-I: <0.02 ng/mL

## 2014-01-07 ENCOUNTER — Emergency Department: Payer: Self-pay | Admitting: Emergency Medicine

## 2014-05-23 ENCOUNTER — Inpatient Hospital Stay
Admit: 2014-05-23 | Disposition: A | Discharge status: Home / Self Care | Payer: Self-pay | Attending: Internal Medicine | Admitting: Internal Medicine

## 2014-05-23 LAB — CBC
HCT: 50.8 % — AB (ref 35.0–47.0)
HGB: 16.5 g/dL — ABNORMAL HIGH (ref 12.0–16.0)
MCH: 29.6 pg (ref 26.0–34.0)
MCHC: 32.5 g/dL (ref 32.0–36.0)
MCV: 91 fL (ref 80–100)
Platelet: 217 10*3/uL (ref 150–440)
RBC: 5.58 10*6/uL — AB (ref 3.80–5.20)
RDW: 13.3 % (ref 11.5–14.5)
WBC: 12.8 10*3/uL — ABNORMAL HIGH (ref 3.6–11.0)

## 2014-05-23 LAB — COMPREHENSIVE METABOLIC PANEL
ALK PHOS: 118 U/L
ALT: 35 U/L
Albumin: 3.2 g/dL — ABNORMAL LOW
Anion Gap: 7 (ref 7–16)
BUN: 15 mg/dL
Bilirubin,Total: 0.4 mg/dL
Calcium, Total: 9.1 mg/dL
Chloride: 104 mmol/L
Co2: 27 mmol/L
Creatinine: 1.31 mg/dL — ABNORMAL HIGH
EGFR (African American): 49 — ABNORMAL LOW
EGFR (Non-African Amer.): 42 — ABNORMAL LOW
Glucose: 187 mg/dL — ABNORMAL HIGH
POTASSIUM: 4.4 mmol/L
SGOT(AST): 26 U/L
SODIUM: 138 mmol/L
Total Protein: 6.4 g/dL — ABNORMAL LOW

## 2014-05-23 LAB — TROPONIN I
TROPONIN-I: 0.09 ng/mL — AB
TROPONIN-I: 0.28 ng/mL — AB
Troponin-I: 0.31 ng/mL — ABNORMAL HIGH

## 2014-05-23 LAB — URINALYSIS, COMPLETE
BLOOD: NEGATIVE
Bacteria: NONE SEEN
Bilirubin,UR: NEGATIVE
Glucose,UR: NEGATIVE mg/dL (ref 0–75)
KETONE: NEGATIVE
Leukocyte Esterase: NEGATIVE
NITRITE: NEGATIVE
PH: 6 (ref 4.5–8.0)
PROTEIN: NEGATIVE
RBC,UR: NONE SEEN /HPF (ref 0–5)
SPECIFIC GRAVITY: 1.006 (ref 1.003–1.030)
WBC UR: <1 /HPF (ref 0–5)

## 2014-05-23 LAB — PROTEIN, BODY FLUID: PROTEIN, BODY FLUID: 3.6 g/dL

## 2014-05-23 LAB — CK-MB
CK-MB: 2.8 ng/mL
CK-MB: 3.3 ng/mL

## 2014-05-23 LAB — PROTIME-INR
INR: 1.2
PROTHROMBIN TIME: 15.5 s — AB

## 2014-05-23 LAB — ALBUMIN, FLUID (OTHER): Body Fluid Albumin: 2.6 g/dL

## 2014-05-24 LAB — BODY FLUID CELL COUNT WITH DIFFERENTIAL
Basophil: 0 %
Eosinophil: 0 %
LYMPHS PCT: 33 %
Neutrophils: 2 %
Nucleated Cell Count: 289 /mm3
OTHER CELLS BF: 4 %
OTHER MONONUCLEAR CELLS: 61 %

## 2014-05-24 LAB — BASIC METABOLIC PANEL
ANION GAP: 4 — AB (ref 7–16)
BUN: 21 mg/dL — AB
CALCIUM: 8.4 mg/dL — AB
Chloride: 107 mmol/L
Co2: 25 mmol/L
Creatinine: 1.32 mg/dL — ABNORMAL HIGH
EGFR (African American): 49 — ABNORMAL LOW
EGFR (Non-African Amer.): 42 — ABNORMAL LOW
GLUCOSE: 159 mg/dL — AB
Potassium: 4 mmol/L
Sodium: 136 mmol/L

## 2014-05-24 LAB — CBC WITH DIFFERENTIAL/PLATELET
Basophil #: 0.1 10*3/uL (ref 0.0–0.1)
Basophil %: 0.7 %
Eosinophil #: 0.2 10*3/uL (ref 0.0–0.7)
Eosinophil %: 2.2 %
HCT: 43.8 % (ref 35.0–47.0)
HGB: 14.4 g/dL (ref 12.0–16.0)
LYMPHS PCT: 24.8 %
Lymphocyte #: 2.5 10*3/uL (ref 1.0–3.6)
MCH: 29.9 pg (ref 26.0–34.0)
MCHC: 33 g/dL (ref 32.0–36.0)
MCV: 91 fL (ref 80–100)
MONOS PCT: 5.7 %
Monocyte #: 0.6 x10 3/mm (ref 0.2–0.9)
Neutrophil #: 6.8 10*3/uL — ABNORMAL HIGH (ref 1.4–6.5)
Neutrophil %: 66.6 %
PLATELETS: 162 10*3/uL (ref 150–440)
RBC: 4.82 10*6/uL (ref 3.80–5.20)
RDW: 13.3 % (ref 11.5–14.5)
WBC: 10.1 10*3/uL (ref 3.6–11.0)

## 2014-05-24 LAB — GLUCOSE, SEROUS FLUID: Glucose, Body Fluid: 143 mg/dL

## 2014-05-24 LAB — MAGNESIUM: Magnesium: 1.8 mg/dL

## 2014-05-24 LAB — CK-MB: CK-MB: 2.8 ng/mL

## 2014-05-24 LAB — LACTATE DEHYDROGENASE, PLEURAL OR PERITONEAL FLUID: LDH, BODY FLUID: 92 U/L

## 2014-05-25 LAB — CA 125: CA 125: 403.4 U/mL — ABNORMAL HIGH

## 2014-05-25 LAB — AFP TUMOR MARKER: AFP-Tumor Marker: 5 ng/mL

## 2014-05-25 LAB — CEA: CEA: 4.8 ng/mL — ABNORMAL HIGH

## 2014-05-25 LAB — CANCER ANTIGEN 19-9: CA 19-9: 2 U/mL

## 2014-05-26 ENCOUNTER — Emergency Department (HOSPITAL_COMMUNITY)
Admission: EM | Admit: 2014-05-26 | Discharge: 2014-06-21 | Disposition: E | Payer: Medicare Other | Attending: Emergency Medicine | Admitting: Emergency Medicine

## 2014-05-26 DIAGNOSIS — Z7901 Long term (current) use of anticoagulants: Secondary | ICD-10-CM | POA: Diagnosis not present

## 2014-05-26 DIAGNOSIS — Z79899 Other long term (current) drug therapy: Secondary | ICD-10-CM | POA: Diagnosis not present

## 2014-05-26 DIAGNOSIS — Z8673 Personal history of transient ischemic attack (TIA), and cerebral infarction without residual deficits: Secondary | ICD-10-CM | POA: Diagnosis not present

## 2014-05-26 DIAGNOSIS — F329 Major depressive disorder, single episode, unspecified: Secondary | ICD-10-CM | POA: Diagnosis not present

## 2014-05-26 DIAGNOSIS — I469 Cardiac arrest, cause unspecified: Secondary | ICD-10-CM | POA: Diagnosis present

## 2014-05-26 DIAGNOSIS — I10 Essential (primary) hypertension: Secondary | ICD-10-CM | POA: Insufficient documentation

## 2014-05-26 DIAGNOSIS — F0390 Unspecified dementia without behavioral disturbance: Secondary | ICD-10-CM | POA: Diagnosis not present

## 2014-05-26 DIAGNOSIS — Z720 Tobacco use: Secondary | ICD-10-CM | POA: Diagnosis not present

## 2014-05-26 DIAGNOSIS — K219 Gastro-esophageal reflux disease without esophagitis: Secondary | ICD-10-CM | POA: Diagnosis not present

## 2014-05-26 DIAGNOSIS — Z794 Long term (current) use of insulin: Secondary | ICD-10-CM | POA: Insufficient documentation

## 2014-05-26 DIAGNOSIS — E780 Pure hypercholesterolemia: Secondary | ICD-10-CM | POA: Diagnosis not present

## 2014-05-27 LAB — BODY FLUID CULTURE

## 2014-05-27 NOTE — ED Notes (Signed)
Patient transported to morgue; [facility] will pick up in the morning.

## 2014-05-27 NOTE — ED Notes (Signed)
Daughter took all jewelry.

## 2014-06-13 NOTE — Consult Note (Signed)
PATIENT NAME:  Audrey Lopez, Audrey Lopez MR#:  [id number] DATE OF BIRTH:  1945-09-07  DATE OF CONSULTATION:  04/05/2013  REFERRING PHYSICIAN:  Dr. Allena KatzPatel. CONSULTING PHYSICIAN:  Lamar BlinksBruce J. Chesney Klimaszewski, MD  REASON FOR CONSULTATION:  Coronary artery disease, hypertension, hyperlipidemia, old pulmonary embolism with acute chest discomfort.   CHIEF COMPLAINT:  "I had chest pain."   HISTORY OF PRESENT ILLNESS:  This is a 69 year old female with known coronary artery disease status post previous PCI and stent placement, hypertension and hyperlipidemia on appropriate medication management, who has had an old pulmonary embolism and has had recent onset of chest discomfort, substernal, awakening her from sleep, substernal, lasting 30 minutes and associated with mild shortness of breath. The patient had full relief of this chest pain spontaneously but now has not returned with appropriate medication including nitrates. The patient has had no return of this with an EKG showing normal sinus rhythm with nonspecific ST changes. She does have a normal troponin.   The remainder of a review of systems negative for vision change, ringing in the ears, hearing loss, cough, congestion, heartburn, nausea, vomiting, diarrhea, bloody stools, stomach pain, extremity pain, leg weakness, cramping of the buttocks, known blood clots, headaches, blackouts, dizzy spells, nosebleeds, congestion, trouble swallowing, frequent urination, urination at night, muscle weakness, numbness, anxiety, depression, skin lesions or skin rashes.   PAST MEDICAL HISTORY: 1.  Coronary artery disease.  2.  Pulmonary embolism.  3.  Hypertension.  4.  Hyperlipidemia.  FAMILY HISTORY:  No family members with early onset of cardiovascular disease or hypertension.   SOCIAL HISTORY:  Currently denies alcohol or tobacco use.   ALLERGIES:  As listed.   MEDICATIONS:  As listed.   PHYSICAL EXAMINATION:  VITAL SIGNS:  Blood pressure is 132/68 bilaterally, heart  rate 72 upright, reclining, and regular.  GENERAL:  Well appearing female in no acute distress.  HEENT:  No icterus, thyromegaly, ulcers, hemorrhage or xanthelasma.  CARDIOVASCULAR:  Regular rate and rhythm. Normal S1 and S2 without murmur, gallop, or rub. PMI is normal size and placement. Carotid upstroke normal without bruit. Jugular venous pressure is normal.  LUNGS:  A few basilar crackles with normal respirations.  ABDOMEN:  Soft, nontender, without hepatosplenomegaly or masses. Abdominal aorta is normal size without bruit.  EXTREMITIES:  Shows 2+ radial, femoral, dorsal pedal pulses, with no lower extremity edema, cyanosis, clubbing or ulcers.  NEUROLOGIC:  Oriented to time, place and person, with normal mood and affect.   ASSESSMENT:  This is a 69 year old female with coronary artery disease, hypertension, hyperlipidemia, old pulmonary embolism, with acute chest pain without current evidence of myocardial infarction and/or EKG changes.   RECOMMENDATIONS:  1.  Continue serial EKG and enzymes to assess for possible a myocardial infarction.  2.  Continue medication management for coronary artery disease and risk factors without change 3.  Ambulation and follow for any further significant symptoms of angina or need for further intervention. If no further symptoms on nitrates and feeling fairly well, possible discharge to home with followup on Monday for outpatient stress test and further evaluation.  ____________________________ Lamar BlinksBruce J. Harli Engelken, MD [email or web address] D: 04/05/2013 12:02:53 ET T: 04/05/2013 12:29:26 ET JOB#: [id number]  cc: Lamar BlinksBruce J. Radonna Bracher, MD, <Dictator> Lamar BlinksBRUCE J Elfida Shimada MD ELECTRONICALLY SIGNED 04/06/2013 17:12

## 2014-06-13 NOTE — H&P (Signed)
PATIENT NAME:  Audrey Lopez, Audrey Lopez MR#:  [id number] DATE OF BIRTH:  1945-06-11  DATE OF ADMISSION:  04/04/2013  REFERRING PHYSICIAN:  Dr. Scotty Court  PRIMARY CARE PHYSICIAN: Dr. Darreld Mclean  CHIEF COMPLAINT: Chest pain.   HISTORY OF PRESENT ILLNESS: A 69 year old Caucasian female with history of coronary artery disease, previous stroke, hypertension, hyperlipidemia, and type 2 diabetes, insulin-requiring, presenting with chest pain. Describes acute onset chest pain, which occurred at rest, located retrosternal, nonradiating, sharp in quality, 8 out of 10 in intensity. Found relief with 2 nitroglycerin. No worsening factors. She had associated shortness of breath and diaphoresis.  Otherwise denies any nausea. Currently without complaints. Initial EKG and enzymes within normal limits.    REVIEW OF SYSTEMS:  CONSTITUTIONAL: Denies fever, chills, fatigue, weakness.  EYES: Denies blurred vision, double vision, eye pain. EARS:  Denies tinnitus, ear pain, hearing loss. RESPIRATORY: Denies cough, wheeze. Positive for shortness of breath as described above.  CARDIOVASCULAR: Positive for chest pain. Denies orthopnea, edema, palpitations.  GASTROINTESTINAL: Denies nausea, vomiting, diarrhea, abdominal pain.  GENITOURINARY: Denies dysuria, hematuria.  ENDOCRINE: Denies nocturia or thyroid problems.  HEMATOLOGIC:  Denies bruising, bleeding.  SKIN: Denies rash or lesions.  MUSCULOSKELETAL: Denies pain in neck, back, shoulder, knees, hips or arthritic symptoms.    NEUROLOGIC: Denies paralysis, paresthesias.  PSYCHIATRIC: Denies anxiety or depressive symptoms.  Otherwise, full review of systems performed is negative.   PAST MEDICAL HISTORY: Peripheral vascular disease, type 2 diabetes, hypertension, hyperlipidemia, history of CVA, gastroesophageal reflux disease, depression, early dementia.   SOCIAL HISTORY: Every day tobacco use. Denies any alcohol or drug usage. Fairly limited in ability at baseline.    FAMILY HISTORY: Positive for coronary artery disease.   ALLERGIES: MOTRIN.   HOME MEDICATIONS: Include warfarin 4 mg p.o. daily, mirtazapine 50 mg p.o. at bedtime. She does take levemir they are unsure of the dosage. atorvastatin 20 mg p.o. at bedtime, Coreg 12.5 mg p.o. b.i.d., amitriptyline 25 mg p.o. at bedtime.   VITAL SIGNS: Temperature 98.7, heart rate 74, respirations 18, blood pressure 174/88, saturating 94% on room air. Weight 59 kg, BMI 21.6.   PHYSICAL EXAMINATION: GENERAL: A well-nourished, well-developed, Caucasian female, currently in no acute distress.  HEAD: Normocephalic, atraumatic.  EYES: Pupils equal, round, reactive to light. Extraocular movements intact. No scleral icterus.  MOUTH: Moist mucosa. Dentition intact. No abscess noted.  EARS, NOSE, THROAT:  Throat is clear without  exudates. No external lesions.  NECK: Supple. No thyromegaly. No nodules. No JVD.  PULMONARY: Clear to auscultation bilaterally without wheezes, rales, or rhonchi. No use of muscles. Good respiratory effort. Chest nontender. CARDIOVASCULAR: S1, S2. Regular rate and rhythm. No murmurs, rubs, gallops. No edema. Pedal pulses 2+ bilaterally.  GASTROINTESTINAL: Soft, nontender, nondistended. No masses. Positive bowel sounds. No hepatosplenomegaly.  MUSCULOSKELETAL: No swelling, clubbing, edema. Range of motion full in all extremities.  NEUROLOGIC: Cranial nerves II through XII intact. No gross focal neurologic deficits. Sensation intact. Reflexes intact.  SKIN: No ulcerations, lesions, rashes, cyanosis. Skin warm, dry. Turgor intact. PSYCHIATRIC:  Mood and affect within normal limits. The patient is awake, alert, oriented x 3. Insight and judgment intact.   LABORATORY DATA: EKG performed revealing normal sinus rhythm, heart rate 70s, no ST or T-wave abnormalities. Chest x-ray performed, revealing no acute cardiopulmonary process. Remainder of lab data:  Sodium 140, potassium 4.4, chloride 106, bicarb  29, BUN 14, creatinine 1.24, glucose 115, albumin 3.3,  Troponin I less than 0.02. WBC 8.7, hemoglobin 16.4, platelets 232. INR 2.1.  ASSESSMENT AND PLAN: A 69 year old Caucasian female with history of coronary artery disease, previous stroke, hypertension, diabetes, presenting with chest pain.   1.  Chest pain. EKG and cardiac enzymes within normal limits. Will continue to trend cardiac enzymes. Give aspirin. Continue statin therapy. She has received nitroglycerin thus far, currently chest pain-free. Admit to telemetry under observation. Her cardiologist is Dr. Gwen PoundsKowalski, though his input is not needed at this time. If enzymes are positive, or if pain persists, can then consult him at that time.   2.  Coronary artery disease. Continue aspirin, statin, and beta blocker therapy.   3.  Type 2 diabetes. Hold p.o. agents. Add insulin sliding scale. She is on a basal insulin; however, she does not know the dosage.  4.  History of pulmonary embolism, on warfarin therapy. Continue warfarin, as therapeutic INR.   5.  Venous thromboembolism prophylaxis, with therapeutic INR. Does not require heparin at this time.   The patient is FULL CODE.   Time spent was 45 minutes.     ____________________________ Cletis Athensavid K. Hower, MD dkh:mr D: 04/04/2013 21:38:58 ET T: 04/04/2013 22:20:53 ET JOB#: [id number]  cc: Cletis Athensavid K. Hower, MD, <Dictator>   DAVID Synetta ShadowK HOWER MD ELECTRONICALLY SIGNED 04/04/2013 23:39

## 2014-06-13 NOTE — Discharge Summary (Signed)
PATIENT NAME:  Audrey Lopez, Audrey Lopez MR#:  [id number] DATE OF BIRTH:  04/23/1945  DATE OF ADMISSION:  04/04/2013 DATE OF DISCHARGE:  04/05/2013  DISCHARGE DIAGNOSES:  1.  Chest pain, likely secondary from gastroesophageal reflux disease, ruled out for acute coronary syndrome.  2.  Hypertension.  3.  Tobacco abuse. The patient counseled for greater than 3 minutes to quit smoking on day of discharge by me.   CONSULTS: Dr. Gwen PoundsKowalski.   IMAGING STUDIES DONE: Include a chest x-ray, portable, which showed no acute abnormalities.   ADMITTING HISTORY AND PHYSICAL AND HOSPITAL COURSE: Please see detailed H and P done by Dr. Clint GuyHower. In brief, a 69 year old patient with history of  CAD, CVA, hypertension admitted to the hospital complaining of chest pain. The patient had 3 sets of negative cardiac enzymes, EKG normal and was seen by Dr. Gwen PoundsKowalski, who suggested followup with him on Monday, 04/07/2013 for an outpatient stress test. The patient is chest pain-free, is ambulating in the hallway without any problems and is being discharged back home. On examination, the patient does not have any chest wall tenderness. Cardiac examination shows S1, S2 without any murmurs.   DISCHARGE MEDICATIONS: Include:  1.  Amitriptyline 25 mg oral once a day.  2.  Coreg 12.5 mg oral 2 times a day.  3.  Warfarin 4 mg oral once a day.  4.  Remeron 15 mg oral once a day.  5.  Atorvastatin 20 mg oral once a day.  6.  Protonix 40 mg daily.  7.  Aspirin 81 mg daily.  8.  Nitroglycerin 0.4 sublingual as needed for chest pain.   DISCHARGE INSTRUCTIONS: Low-sodium, regular consistency diet.   FOLLOWUP: With Dr. Gwen PoundsKowalski on 04/07/2013 for a stress test in his office.   TIME SPENT ON DAY OF DISCHARGE IN DISCHARGE ACTIVITY: 43 minutes.  ____________________________ Molinda BailiffSrikar R. Naileah Karg, MD srs:aw D: 04/07/2013 14:12:33 ET T: 04/07/2013 14:47:11 ET JOB#: [id number]  cc: Wardell HeathSrikar R. Elpidio AnisSudini, MD, <Dictator> Lamar BlinksBruce J. Kowalski, MD Leanna SatoLinda Lopez.  Miles, MD Orie FishermanSRIKAR R Iverna Hammac MD ELECTRONICALLY SIGNED 04/10/2013 14:10

## 2014-06-21 DIAGNOSIS — 419620001 Death: Secondary | SNOMED CT | POA: Insufficient documentation

## 2014-06-21 NOTE — Code Documentation (Signed)
Patient asystole in all leads.

## 2014-06-21 NOTE — H&P (Signed)
PATIENT NAME:  Audrey Lopez, Audrey Lopez MR#:  [id number] DATE OF BIRTH:  [birth year]  DATE OF ADMISSION:  05/23/2014  PRIMARY CARE PHYSICIAN: Dr. Marvis Moeller   CHIEF COMPLAINT: Shortness of breath today.   HISTORY OF PRESENT ILLNESS: A 69 year old Caucasian female with a history of hypertension, diabetes, PVD, brought in the ED with the above chief complaint. The patient is alert, awake, oriented, on Ventimask treatment. The patient said she started to have shortness of breath, cough without sputum today. In addition, the patient has mild wheezing, but she denies any chest pain, palpitation, orthopnea, or nocturnal dyspnea. No leg edema. The patient's 02 saturation was 80 in the ED, was treated with oxygen and put on Ventimask. Chest x-ray shows a moderate to large pleural effusion on the right side. According to the patient, the patient recently had a right pleural effusion, was typed in [facility] but the pleural effusion test was negative. The patient denies any other symptoms.   PAST MEDICAL HISTORY: Recent pleural effusion 10 days ago, PVD, hypertension, diabetes, hyperlipidemia, history of CVA, GERD, early dementia, history of PE on Coumadin.   SOCIAL HISTORY: The patient quit smoking this February. Denies any alcohol or drug abuse.   FAMILY HISTORY: CAD.  ALLERGIES: MOTRIN.    HOME MEDICATIONS: Coumadin 4 mg p.o. daily, nitroglycerin 0.4 mg sublingual p.r.n. for chest pain, mirtazapine 15 mg p.o. daily at bedtime, methadone 10 mg every 6 hours, Coreg 12.5 mg p.o. b.i.d., atorvastatin 20 mg p.o. at bedtime, amitriptyline 25 mg p.o. at bedtime.   REVIEW OF SYSTEMS:  CONSTITUTIONAL: The patient denies any fever or chills. No headache or dizziness, but has generalized weakness.  EYES: No double vision, blurry vision.  EARS, NOSE, AND THROAT: No postnasal drip, slurred speech or dysphagia.  CARDIOVASCULAR: No chest pain, palpitation. No orthopnea. No nocturnal dyspnea. No leg edema.  PULMONARY:  Positive for cough, shortness of breath and wheezing, but no sputum or hematemesis.  GASTROINTESTINAL: No abdominal pain, nausea, vomiting, diarrhea. No melena or bloody stool.  GENITOURINARY: No dysuria, hematuria, or incontinence.  SKIN: No rash or jaundice.  NEUROLOGY: No syncope, loss of consciousness, or seizure.  ENDOCRINOLOGY: No polyuria, polydipsia, heat or cold intolerance.  SKIN: No rash or jaundice.  HEMATOLOGY: No easy bruising or bleeding.   PHYSICAL EXAMINATION: VITAL SIGNS: Temperature 98.6, blood pressure 128/93, pulse was 119 and now is 108, O2 saturation was 80 on room air and 93 on oxygen on Ventimask.  GENERAL: The patient is alert, awake, oriented, in acute distress.  HEENT: Pupils round, equal, and reactive to light and accommodation. Moist oral mucosa. Clear oropharynx.  NECK: Supple. No JVD or carotid bruit. No lymphadenopathy. No thyromegaly.  CARDIOVASCULAR: S1 and S2. Regular rate and rhythm. No murmurs or gallops. Tachycardia.  PULMONARY: Bilateral air entry. No breath sounds on the right side. The left side is clear. No crackles, no wheezing. No use of accessory muscles to breathe.  ABDOMEN: Soft. No distention or tenderness. No organomegaly. Bowel sounds present.  EXTREMITIES: No edema, clubbing or cyanosis. No calf tenderness. Trace edema on the right lower extremity. No edema, clubbing or cyanosis. No calf tenderness. Bilateral pedal pulses present.  SKIN: No rash or jaundice.  NEUROLOGY: A and O x 3. No focal deficit. Power 5/5. Sensation intact.   LABORATORY DATA: EKG showed sinus tachycardia at 107. BPM: Glucose 187, BUN 15, creatinine 1.31. Electrolytes normal. Troponin 0.09. WBC 12.8, hemoglobin 16.5, platelets 217,000. INR 1.2.   IMPRESSIONS: Chest x-ray showed right moderate  to large pleural effusion, left side is clear.   IMPRESSIONS: 1.  Acute respiratory failure with hypoxia, possibly due to a pleural effusion.  2.  Right moderate to large pleural  effusion.  3.  Acute renal failure.  4.  Elevated troponin, possibly due to demand ischemia or acute renal failure.  5.  Leukocytosis.  6.  Hypertension, controlled.  7.  Diabetes.  8.  History of pulmonary embolus, on Coumadin.  9.  History of coronary artery disease, peripheral artery disease.   PLAN OF TREATMENT:  1.  The patient will be admitted to medical floor and continue oxygen by nasal cannula. Will get a thoracentesis by radiologist and will get a pleural effusion cytology and other tests. We will get a pulmonary consult.  2. According to Dr. Mindi JunkerGottlieb, the patient recently was suspected abdominal tumor. We will get tumor marker including AFP, CA-125 and a CA 19-9 and [facility].  3.  For history of a PE, I will hold Coumadin at this time due to thoracentesis. In addition, I will get an oncology consult.  4.  For hypertension, we will continue the patient's home medications.  5.  Diabetes, we will start a sliding scale.  6.  I discussed the patient's condition and plan of treatment with the patient and the patient's granddaughter. The patient wants limited code. She does not want intubation but wants CPR and other medical treatment including cardioversion.   TIME SPENT: About 63 minutes.    ____________________________ Shaune PollackQing Darcell Yacoub, MD qc:at D: 05/23/2014 11:19:09 ET T: 05/23/2014 11:37:09 ET JOB#: [id number]  cc: Shaune PollackQing Larrisa Cravey, MD, <Dictator> Shaune PollackQING Paisley Grajeda MD ELECTRONICALLY SIGNED 06/19/2014 20:30

## 2014-06-21 NOTE — Code Documentation (Signed)
Patient time of death occurred at 2334

## 2014-06-21 NOTE — ED Notes (Signed)
[facility] at her residence. Patient was found in apnea and PEA. King Airway was placed by EMS and I/O was placed to right lower leg. EMS gave 5 mg of EPI and 2 mg of Narcan enroute to the ER.  Blood glucose was 302 and NS was given. Patient remained in PEA and then went into asystole per EMS.

## 2014-06-21 NOTE — ED Provider Notes (Signed)
CSN: [id number]     Arrival date & time 2014/06/02  2331 History  This chart was scribed for Devoria Albe, MD by Murriel Hopper, ED Scribe. This patient was seen in room APA01/APA01 and the patient's care was started at 11:32 PM.    Chief Complaint  Patient presents with  . Cardiac Arrest   LEVEL 5 CAVEAT for unresponsive  Pt was seen on arrival  HPI   HPI Comments: Audrey Lopez is a 69 y.o. female who presents to the Emergency Department via EMS. Per EMS pt was in hospital two days ago for fluid in her lungs.She had a thoracentesis done to remove fluid and was discharged home on oxygen.  Her daughter states she acutely got sick and started vomiting tonight then she started struggling to breathe. Her daughter called 911 and she continued to have difficulty breathing. There was no bystander CPR performed. EMS states she was not breathing upon arrival and had no spontaneous pulses on ride over to hospital. CPR was started and she was placed on a thumper. Her initial rhythm was a slow PEA then degraded to asystole. Pt received 5 mg epi and 2 mg narcan per IO.  EMS states pt had a MOST form, but did not follow it because her family was hysterical upon their arrival and took it away and wanted them to do CPR. CBG was 302. Pt is on methadone which is why they gave narcan. EMS states they started CPR about 22:50.   PCP Dr Darreld Mclean of [facility]  Past Medical History  Diagnosis Date  . Hypercholesteremia   . Depression   . Stroke   . Dementia   . Hypertension   . GERD (gastroesophageal reflux disease)    No past surgical history on file. No family history on file. History  Substance Use Topics  . Smoking status: Current Every Day Smoker -- 1.00 packs/day    Types: Cigarettes  . Smokeless tobacco: Not on file  . Alcohol Use: No   OB History    No data available     Review of Systems  Unable to perform ROS: Patient unresponsive      Allergies  Motrin  Home Medications    Prior to Admission medications   Medication Sig Start Date End Date Taking? Authorizing Provider  amitriptyline (ELAVIL) 25 MG tablet Take 25 mg by mouth at bedtime.      Historical Provider, MD  carvedilol (COREG) 12.5 MG tablet Take 12.5 mg by mouth 2 (two) times daily with a meal.      Historical Provider, MD  donepezil (ARICEPT) 10 MG tablet Take 10 mg by mouth at bedtime as needed. Memory loss     Historical Provider, MD  insulin glargine (LANTUS) 100 UNIT/ML injection Inject 28-30 Units into the skin at bedtime.     Historical Provider, MD  insulin lispro (HUMALOG) 100 UNIT/ML injection Inject 10 Units into the skin 3 (three) times daily before meals. If sugar is over 130 add 10 units as base and for each additional 50 add 2 units     Historical Provider, MD  lovastatin (MEVACOR) 40 MG tablet Take 40 mg by mouth at bedtime.      Historical Provider, MD  memantine (NAMENDA) 10 MG tablet Take 10 mg by mouth 2 (two) times daily.      Historical Provider, MD  niacin 500 MG tablet Take 1,000 mg by mouth 2 (two) times daily with a meal.     Historical  Provider, MD  omeprazole (PRILOSEC) 20 MG capsule Take 20 mg by mouth daily.      Historical Provider, MD  sertraline (ZOLOFT) 25 MG tablet Take 25 mg by mouth daily.      Historical Provider, MD  warfarin (COUMADIN) 3 MG tablet Take 6 mg by mouth daily. On Mon, and Sat. Patient takes 2 tablets(3mg )=6mg     Historical Provider, MD  warfarin (COUMADIN) 4 MG tablet Take 4 mg by mouth daily. Patient takes 4 mg on Sun,Tues.,Wed.,Thur.,Fri.    Historical Provider, MD   Pulse 0  Resp 0  Wt 140 lb (63.504 kg) Physical Exam  Constitutional:  Appeared older than her stated age  HENT:  King airway in place my EMS  Eyes:  Pupils fixed and dilated  Cardiovascular:  No heart tones heard  Pulmonary/Chest:  No spontaneous respirations. Thumper in place  Musculoskeletal:  IO in Right tibia  Neurological:  unresponsive  Nursing note and vitals  reviewed.   ED Course  Procedures (including critical care time)   COORDINATION OF CARE: 11:32 PM Thumper stopped, pulses checked. No spontaneous respirations, pulse or heart tones heard. Pt pronounced dead at 2334.   11:42 PM Spoke to family in waiting room.    00:35 I spoke to Dr Cathie HoopsYu on call for Dr Darreld [facility]cleanLinda Miles [facility], she is going to look in their records to make sure she is their patient and call me back  00:46 Dr Cathie HoopsYu states Dr Marvis MoellerMiles should be able to sign the death certificate  00:55 Volney Presserim McNeil, ME states patient is not a ME case    Labs Review Labs Reviewed - No data to display  Imaging Review No results found.   EKG Interpretation None      MDM   Final diagnoses:  D.O.A. (dead on arrival)    I personally performed the services described in this documentation, which was scribed in my presence. The recorded information has been reviewed and considered.  Devoria AlbeIva Tauno Falotico, MD, Concha PyoFACEP   Shakeita Vandevander, MD 05/27/14 [phone]

## 2014-06-21 NOTE — Discharge Summary (Signed)
PATIENT NAME:  Audrey RheinUNDERWOOD, Kera M MR#:  [id number] DATE OF BIRTH:  February 18, 1946  DATE OF ADMISSION:  05/23/2014 DATE OF DISCHARGE:  05/24/2014  For a detailed note, please take a look at the history and physical done on admission by Dr. Imogene Burnhen.  DISCHARGE DIAGNOSES:   1.  Acute respiratory failure with hypoxia secondary to pleural effusion. 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 2.  Intra-abdominal mass, etiology unclear.  3.  History of chronic obstructive pulmonary disease. 4.  Hypertension.  5.  Hyperlipidemia. 6.  Chronic pain. 7.  History of pulmonary embolism.  DISCHARGE DIET: The patient is being discharged in a low-sodium diet.   DISCHARGE ACTIVITY: As tolerated.   DISCHARGE INSTRUCTIONS: Follow-up is with Dr. Darreld McleanLinda Miles, [facility] in the next 1 to 2 weeks.    DISCHARGE MEDICATIONS: Amitriptyline 25 mg at bedtime, atorvastatin 20 mg at bedtime, methadone 10 mg q.i.d. as needed, albuterol inhaler 2 puffs q.i.d. as needed, Qvar 80 mcg 2 puffs b.i.d., DuoNebs b.i.d. as needed.   CONSULTANTS DURING HOSPITAL COURSE: Dr. Belia HemanKasa from pulmonary critical care, Dr. Darrold JunkerParaschos from cardiology, Dr. Lorre NickGittin from oncology.  PERTINENT STUDIES DURING HOSPITAL COURSE:  Chest x-ray done on admission showed opacification throughout the mid and right lower chest consistent with a moderate to large right-sided pleural effusion.   Chest x-ray post thoracentesis showed small remaining right pleural effusion with residual right middle lobe and lower lobe partial collapse and consolidation.   HOSPITAL COURSE: This is a 69 year old female with medical problems as mentioned above who presented to the hospital with shortness of breath and noted to be hypoxic.  1.  Acute respiratory failure with hypoxia. This was likely secondary to the pleural effusion. The patient was admitted to the hospital, underwent ultrasound-guided thoracentesis and had about 1800 mL of fluid removed.  The fluid studies are consistent with a transudative effusion. The patient was ambulated on room air and did desaturate to 86%; therefore, home oxygen is being arranged for her.  2.  Pleural effusion. This is recurrent as the patient just had a recent thoracentesis about 10 days ago. It is thought to be malignant, but the cytology results are currently pending. She apparently had pleural effusion that was removed 10 days ago. The cytologies on that are also negative, but the given recurrent nature this is likely to be malignant. The patient does not want any further work-up done here, but [facility] which is where she is currently being discharged to follow up with.  3.  Chronic pain. The patient was maintained on methadone. She will resume that.  4.  Hyperlipidemia. The patient was maintained on atorvastatin. She will resume that. 5.  Depression. The patient was maintained on Remeron. She will resume that.  6.  Elevated troponin. This was likely in the setting of demand ischemia from hypoxia. The patient was seen by cardiology. They did not recommend any acute intervention.  7.  Intra-abdominal mass. The patient [facility] and biopsies, the results of which are actually negative for malignancy, but the patient is set up to have another biopsy done this week and she would continue that follow up.  7.  History of pulmonary embolism. The patient was on Coumadin, although I am currently holding that as the patient likely needs an intra-abdominal biopsy coming up later this week.   CODE STATUS: Limited code.   DISPOSITION: She is being discharged home.   TIME SPENT ON  DISCHARGE: 35 minutes.   ____________________________ Rolly Pancake. Cherlynn Kaiser, MD vjs:sb D: 05/24/2014 14:01:24 ET T: 05/25/2014 07:55:57 ET JOB#: [id number]  cc: Rolly Pancake. Cherlynn Kaiser, MD, <Dictator> Leanna Sato, MD [facility] [place] Siren MD ELECTRONICALLY SIGNED 05/28/2014 16:23

## 2014-06-21 NOTE — Consult Note (Signed)
PATIENT NAME:  Audrey Lopez, Audrey Lopez MR#:  [id number] DATE OF BIRTH:  [birth year]  DATE OF CONSULTATION:  05/23/2014  REFERRING PHYSICIAN:   CONSULTING PHYSICIAN:  Knute Neu. Lorre Nick, MD  HISTORY OF PRESENT ILLNESS: Audrey Lopez is a 69 year old patient who was admitted today, April 2, with shortness of breath. History is taken from the admission note and from the patient, although she has a history of stroke and memory lapses. Also from nursing who had spoken to the patient's daughter earlier. Medical records are pending. The history that we can put together at this time is that this patient recently had been ill with shortness of breath and some abdominal pain. Her primary doctor is Dr. Marvis Moeller in the [facility]. She went to [facility] and saw a pulmonologist. She had a pleural effusion. She had an outpatient thoracentesis that was not diagnostic. She had abdominal pains and abdominal mass. She has seen a gynecologist for a planned hysterectomy. Apparently, she had evaluation but did not have surgery.   She was to have a hysterectomy but could not do that due to what was said to be a colon mass. There is a history of that mass being biopsied, but the patient was told that it was not cancer. She reports seeing Dr. Mechele Collin for possible colonoscopy and was told that she should not have one since she had had a colonoscopy in the recent past. With regard to that, there is no colonoscopy in our system, except in 2009. The patient came to our hospital with increased shortness of breath, but was pending a follow-up at [facility]. In fact, she had stopped her outpatient Coumadin in anticipation of what was either going to be another thoracentesis or an abdominal biopsy. This admission, she was hypoxic and was placed on 100% nonrebreather mask. Again, documented pleural effusion on chest x-ray.   PAST HISTORY: Includes peripheral vascular disease, hypertension, diabetes, hyperlipidemia, CVA, GERD early dementia. Pulmonary  embolism approximately 10 years ago and the patient has been given the advice to take indefinite lifetime Coumadin. There is a history of coronary artery disease and she maybe had a cardiac or peripheral artery stent.   ALLERGIES THERE IS A STATED ALLERGY TO MOTRIN.   FAMILY HISTORY: Apparently includes heart disease.   SOCIAL HISTORY: The patient was a smoker, just quit this February. Did not get a total pack-year history.   MEDICATIONS AT HOME: Were Coumadin 4 mg daily, nitroglycerin 0.4 mg sublingual p.r.n. for chest pain, mirtazapine 15 mg p.o. at night and methadone 10 mg every 6 hours for chronic back pain. The patient sees another provider in [facility] and could not name that specialist. Also on Coreg 12.5 mg p.o. b.i.d., atorvastatin 20 mg p.o. at night, amitriptyline 25 mg at night.   REVIEW OF SYSTEMS: On additional system review: The patient denies headache or dizziness, chills or sweats, fever, cough, wheezing or hemoptysis, but has had shortness of breath as noted. No abdominal pain. No nausea or vomiting, diarrhea. Denies focal weakness or bruising or edema.   PHYSICAL EXAMINATION: VITAL SIGNS: Were unremarkable except for the pulse was 119 and blood pressure 120/93. Oxygen saturation was 80% on room air and then 93% on the Ventimask. It was running 99% to 100% here on the floor.  GENERAL: Alert and cooperative. Slight pallor.  HEENT: Sclerae clear. Mouth: No thrush.  LUNGS: There is some air entry at the right apex, otherwise absent air entry and dullness. The left side is clear.  ABDOMEN: There is  a palpable mass in right mid abdomen and right lower quadrant that is tender. The rest of the abdomen is soft.  EXTREMITIES: There is no edema.  NEUROLOGIC: Grossly nonfocal. Cranial nerves intact. There are memory lapses.   DIAGNOSTIC DATA: Chest x-ray chest x-ray shows the right mid and lower chest is opacified with moderate to large sized pleural effusion.   LABORATORY DATA:  Admission creatinine was 1.3. The calcium was normal. Liver functions were normal. Albumin is 3.2, total protein is 6.4, white count 12.8, hemoglobin 16.5, hematocrit was 50.8, platelet count is 217,000. PT/INR was 1.2 and a troponin was 0.09.   IMPRESSION AND PLAN: 1. The patient has a pleural effusion by history. A tap x 1 was negative. Highly suspicious for underlying cancer, which seen in the absence of negative pleural fluid, a repeat tap is appropriately indicated. There are other causes, but there is no apparent clear heart failure or evidence of infection, autoimmune disease, tuberculosis or other potential causes or isolated pleural effusion and this needs to be investigated.  2. There is an abdominal mass that is slightly tender by history. This has been seen, evaluated and [facility] and biopsied. History of this is not known, but this is highly suspicious for malignancy.  3. The patient may have dehydration. Has a mild polycythemia. There is a mild leukocytosis, although this is certainly compatible with acute inflammation. This patient might have chronic obstructive pulmonary disease, sleep apnea, but just likely as she has been hypoxic and this elevated hemoglobin and hematocrit is a reaction to hypoxia and possibly hemoconcentration. There is no reason currently to be suspicious of a myeloproliferative disease or polycythemia. The INR was slightly high, compatible with the patient's coumadin hx but is now temporarily holding Coumadin. Another issue is the patient's stated preference to have all work-up and follow-up treatment, x-rays, any possible surgeries or if a malignancy, any cancer treatments at [facility].   SUGGESTIONS:  1. Serial CBC, appropriate IV fluids. Make sure that the hemoglobin, hematocrit and white count are normalized, otherwise would pursue looking at parameters for polycythemia vera starting with erythropoietin level, spleen size and JAK mutations.  2. Obtain all the  records from [facility].  3. Thoracentesis, diagnostic, with complete analysis of fluid including fluid cytology, but also thoracentesis is for therapeutic advantage.  4. Look at [facility], given the patient's preferences for follow-up and treatment. Otherwise when she is suitable for discharge arrange [facility] follow-up.  5. Regarding anticoagulation, would get her back on anticoagulation with either Lovenox bridging to Coumadin or Lovenox alone if another invasive procedure is later planned or heparin drip while the patient is in the hospital given the history and clot risk.    ____________________________ Knute Neuobert G. Lorre NickGittin, MD rgg:TT D: 05/23/2014 17:03:32 ET T: 05/23/2014 17:20:35 ET JOB#: [id number]  cc: Knute Neuobert G. Lorre NickGittin, MD, <Dictator> Marin RobertsOBERT G Mailey Landstrom MD ELECTRONICALLY SIGNED 06/13/2014 16:11

## 2014-06-21 NOTE — Consult Note (Signed)
PATIENT NAME:  Audrey Lopez, Audrey Lopez MR#:  [id number] DATE OF BIRTH:  02/06/1946  DATE OF CONSULTATION:  05/24/2014  REFERRING PHYSICIAN:  Dr. Imogene Burnhen. CONSULTING PHYSICIAN:  Marcina MillardAlexander Lien Lyman, MD  PRIMARY CARE PHYSICIAN:  Darreld McleanLinda Miles, [name]   CARDIOLOGIST: Lamar BlinksBruce J. Kowalski, [name]   CHIEF COMPLAINT: Shortness of breath.   HISTORY OF PRESENT ILLNESS: The patient is a 69 year old female with history of tobacco abuse, COPD, hypertension, type 2 diabetes, who was admitted on 05/23/2014 with chief complaint of shortness of breath. Chest x-ray revealed evidence for a large right-sided pleural effusion. The patient underwent thoracentesis with symptomatic improvement. Admission labs were notable for borderline elevated troponin of 0.31 in the absence of chest pain or new ECG changes. The patient has had a recent cardiac evaluation including echocardiogram on 01/14/2014 which revealed normal left ventricular function. The patient underwent Lexiscan, Sestamibi study on 01/14/2014 which revealed normal left ventricular function without evidence for scar or ischemia.   PAST MEDICAL HISTORY:  1.  Tobacco abuse/COPD.  2.  Hypertension.  3.  Diabetes.  4.  Hyperlipidemia.  5.  History of CVA.  6.  History of pulmonary embolus previously on Coumadin.   MEDICATIONS: Coumadin 4 mg daily, carvedilol 12.5 mg b.i.d., atorvastatin 20 mg daily, sublingual nitroglycerin p.r.n., mirtazapine 15 mg at bedtime, methadone 10 mg q. 6 p.r.n., amitriptyline 25 mg at bedtime.   SOCIAL HISTORY: The patient has long-standing tobacco abuse history, quit February 2016.   FAMILY HISTORY: Positive for coronary artery disease.   REVIEW OF SYSTEMS: CONSTITUTIONAL: No fever or chills.  EYES: No blurry vision.  EARS: No hearing loss.  RESPIRATORY: Shortness of breath as described above.  CARDIOVASCULAR: No chest pain.  GASTROINTESTINAL: No nausea, vomiting, or diarrhea.  GENITOURINARY: No dysuria or hematuria.  ENDOCRINE: No  polyuria or polydipsia.  MUSCULOSKELETAL: No arthralgias or myalgias.  NEUROLOGICAL: No focal muscle weakness or numbness.  PSYCHOLOGICAL: No depression or anxiety.   PHYSICAL EXAMINATION:  VITAL SIGNS: Blood pressure 80/55, pulse 91, respirations 20, temperature 97.9, pulse oximetry 95%.  HEENT: Pupils equal and reactive to light and accommodation.  NECK: Supple without thyromegaly.  LUNGS: Reveal decreased breath sounds in the right base.  CARDIOVASCULAR: Normal JVP. Normal PMI. Regular rate and rhythm. Normal S1, S2. No appreciable gallop, murmur, or rub.  ABDOMEN: Soft and nontender. Pulses were intact bilaterally.  MUSCULOSKELETAL: Normal muscle tone.  NEUROLOGIC: The patient is alert and oriented x 3. Motor and sensory both grossly intact.   IMPRESSION: A 69 year old female who presents with chief complaint of shortness of breath, found to have a large right pleural effusion, symptoms have improved after thoracentesis. The patient has had an incidental finding of borderline elevated troponin in the absence of chest pain or new ECG changes. I doubt this is due to acute coronary syndrome and more likely due to his demand supply ischemia.   RECOMMENDATIONS:  1.  Agree with overall current therapy.  2.  Would defer full dose anticoagulation.  3.  Defer further cardiac workup at this time.   ____________________________ Marcina MillardAlexander Alexei Ey, MD ap:sp D: 05/24/2014 09:50:48 ET T: 05/24/2014 10:23:22 ET JOB#: [id number]  cc: Marcina MillardAlexander Carols Clemence, MD, <Dictator> Marcina MillardALEXANDER Lala Been MD ELECTRONICALLY SIGNED 06/16/2014 17:15

## 2014-06-21 NOTE — Consult Note (Signed)
Brief Consult Note: Diagnosis: PLUERAL EFFUSION, ABDOMINAL MASS.   Patient was seen by consultant.   Consult note dictated.   Comments: SEE DICTATED NOTE. HX FROM PATIENT WHO HAS MEMORY LAPSES AND FROM NURSING, WHO HAVE SPOKEN PRIOR TO DAUGHTER.  [facility]. PMH INCLUDE PE 10 YRS AGO, AND HAD BEEN RECOMMENDED LIFETIME COUMADIN. RECENT ABDOMINAL MASS, ABDOMINAL PAIN, XRAYS AND SCANS AT [facility],  HAD PELVIC OR ABDO MASS, WENT TO SURGERY FOR HYSTERECTOMY, BUT NOT DONE DUE TO MASS ON COLON..BUT BIOPSY NEGATIVE.  PATIENT REPORTS RFECENTLY SAW DR Markham JordanELLIOT BUT ADVISED AGAINST COLONOSCOPY. RECENTLY DISCOVEREDF PLEURAL EFFUSION, THORACENTESIS AT [facility], NON DIAGNOSTIC, HAS PLANS FOR F/U MAY HAVE BEEN ON 4/6, AS WAS HOLDING REGULAR COUMADIN. ALSO PATIENT WANTS F/U AT [facility], DOES NOT WANT TREATMENT OR SURGERY OF IF FOUND A CANCER ANY CANCER CARE [facility].....Marland Kitchen.SUGG  1. OBTAIN RECORDS.  2 TRANSFER TO [facility] OR THORACENTESIS FOR DIAGNOSIS AND THERAPEUTIC, THEN DISCHARGE HOME TO FOLLOW UP AT [facility]. 3. RESUME ANTICOAGULATION WITH LOVENOX BRIDGE TO COUMADIN OR LOVENOX UNTIL SEEN IN F/U, IN CASE INVASIVE PROCEEDURE PLANNED. 4. IF THORACENTESIS DONE, NEED TO INCLUDE CYTOLOGY AND NEED TO ARRANGE WITH RADIOLOGY THAT ALL FLUID FOR CELL BLOCK SENT APPROPRIATELY.  Electronic Signatures: Marin RobertsGittin, Robert G (MD)  (Signed 02-Apr-16 17:07)  Authored: Brief Consult Note   Last Updated: 02-Apr-16 17:07 by Marin RobertsGittin, Robert G (MD)

## 2014-06-21 NOTE — H&P (Signed)
PATIENT NAME:  Audrey Lopez, Audrey Lopez MR#:  [id number] DATE OF BIRTH:  03-14-1945  DATE OF ADMISSION:  05/23/2014  ADDENDUM:   1.  The patient also has a history of COPD but the patient has no COPD exacerbation. We will get Xopenex q. 4 hours.  2.  History of tobacco abuse. The patient was counseled for smoking cessation for 4 minutes. We will give a nicotine patch.    ____________________________ Shaune PollackQing Ryker [place], MD qc:at D: 05/23/2014 11:22:28 ET T: 05/23/2014 12:09:21 ET JOB#: [id number]  cc: Shaune PollackQing Shemia Bevel, MD, <Dictator> Shaune PollackQING Nyana Haren MD ELECTRONICALLY SIGNED 05/23/2014 17:01

## 2014-06-21 DEATH — deceased

## 2015-07-22 IMAGING — US US GUIDE NEEDLE - US [PERSON_NAME]
1 series · 2 of 2 positions shown · non-contrast
Comparison: 05/23/2014

CLINICAL DATA: Shortness of breath, enlarging right pleural
effusion

EXAM:
ULTRASOUND GUIDED RIGHT THORACENTESIS

[Series 1: us guide needle - us (person_name) · 0.27mm/px · 2 of 2 slices shown]
[im 1/2]
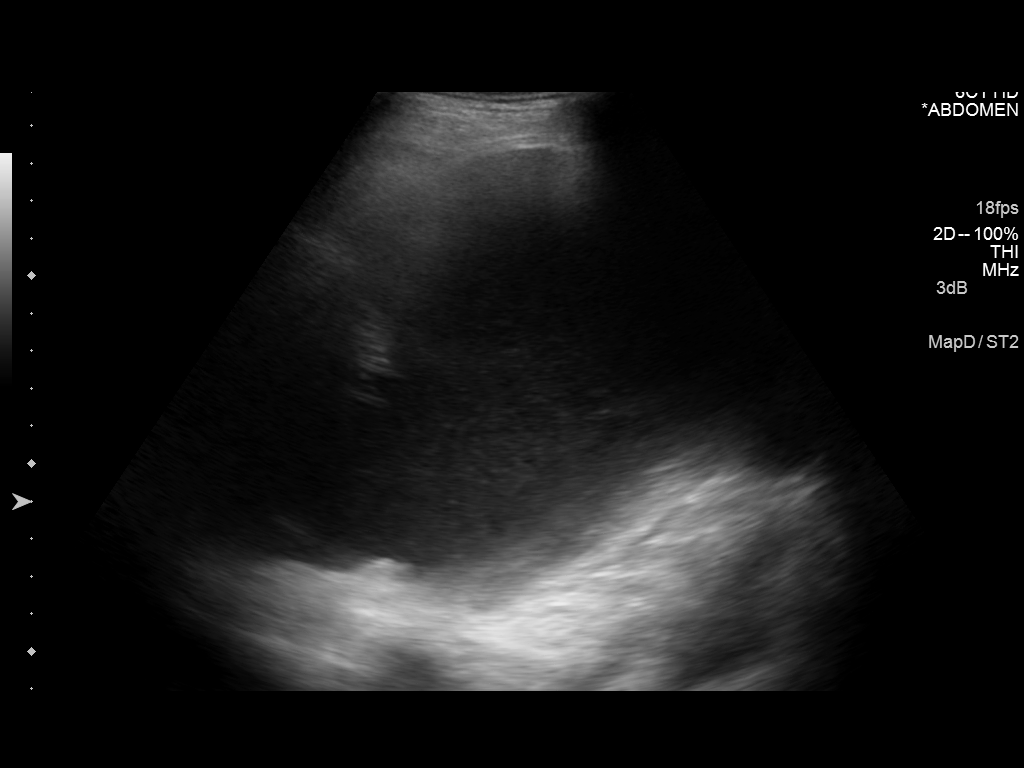
[im 2/2]
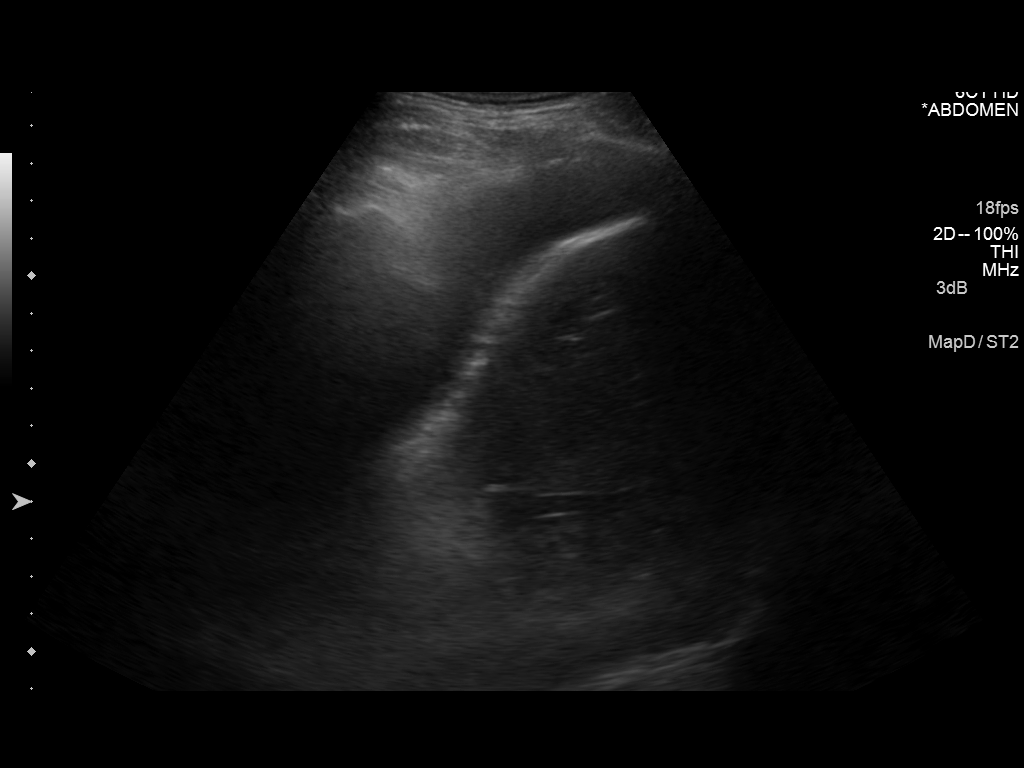

[2 of 2 positions shown; findings below may reference images not displayed]

PROCEDURE:
An ultrasound guided thoracentesis was thoroughly discussed with the
patient and questions answered. The benefits, risks, alternatives
and complications were also discussed. The patient understands and
wishes to proceed with the procedure. Written consent was obtained.

Ultrasound was performed to localize and mark an adequate pocket of
fluid in the right chest. The area was then prepped and draped in
the normal sterile fashion. 1% Lidocaine was used for local
anesthesia. Under ultrasound guidance a safety centesis needle
catheter was introduced. Thoracentesis was performed. The catheter
was removed and a dressing applied.

Complications:  None immediate
FINDINGS: A total of approximately 1.8 L of amber colored pleural fluid was
removed. A fluid sample wassent for laboratory analysis.
IMPRESSION: Successful ultrasound guided right thoracentesis yielding 1.8 L of
pleural fluid.

## 2015-07-22 IMAGING — CR DG CHEST 1V PORT
1 series · 1 of 1 positions shown · non-contrast
Comparison: 04/04/2013

CLINICAL DATA: Shortness of breath.

EXAM:
PORTABLE CHEST - 1 VIEW

[ap]
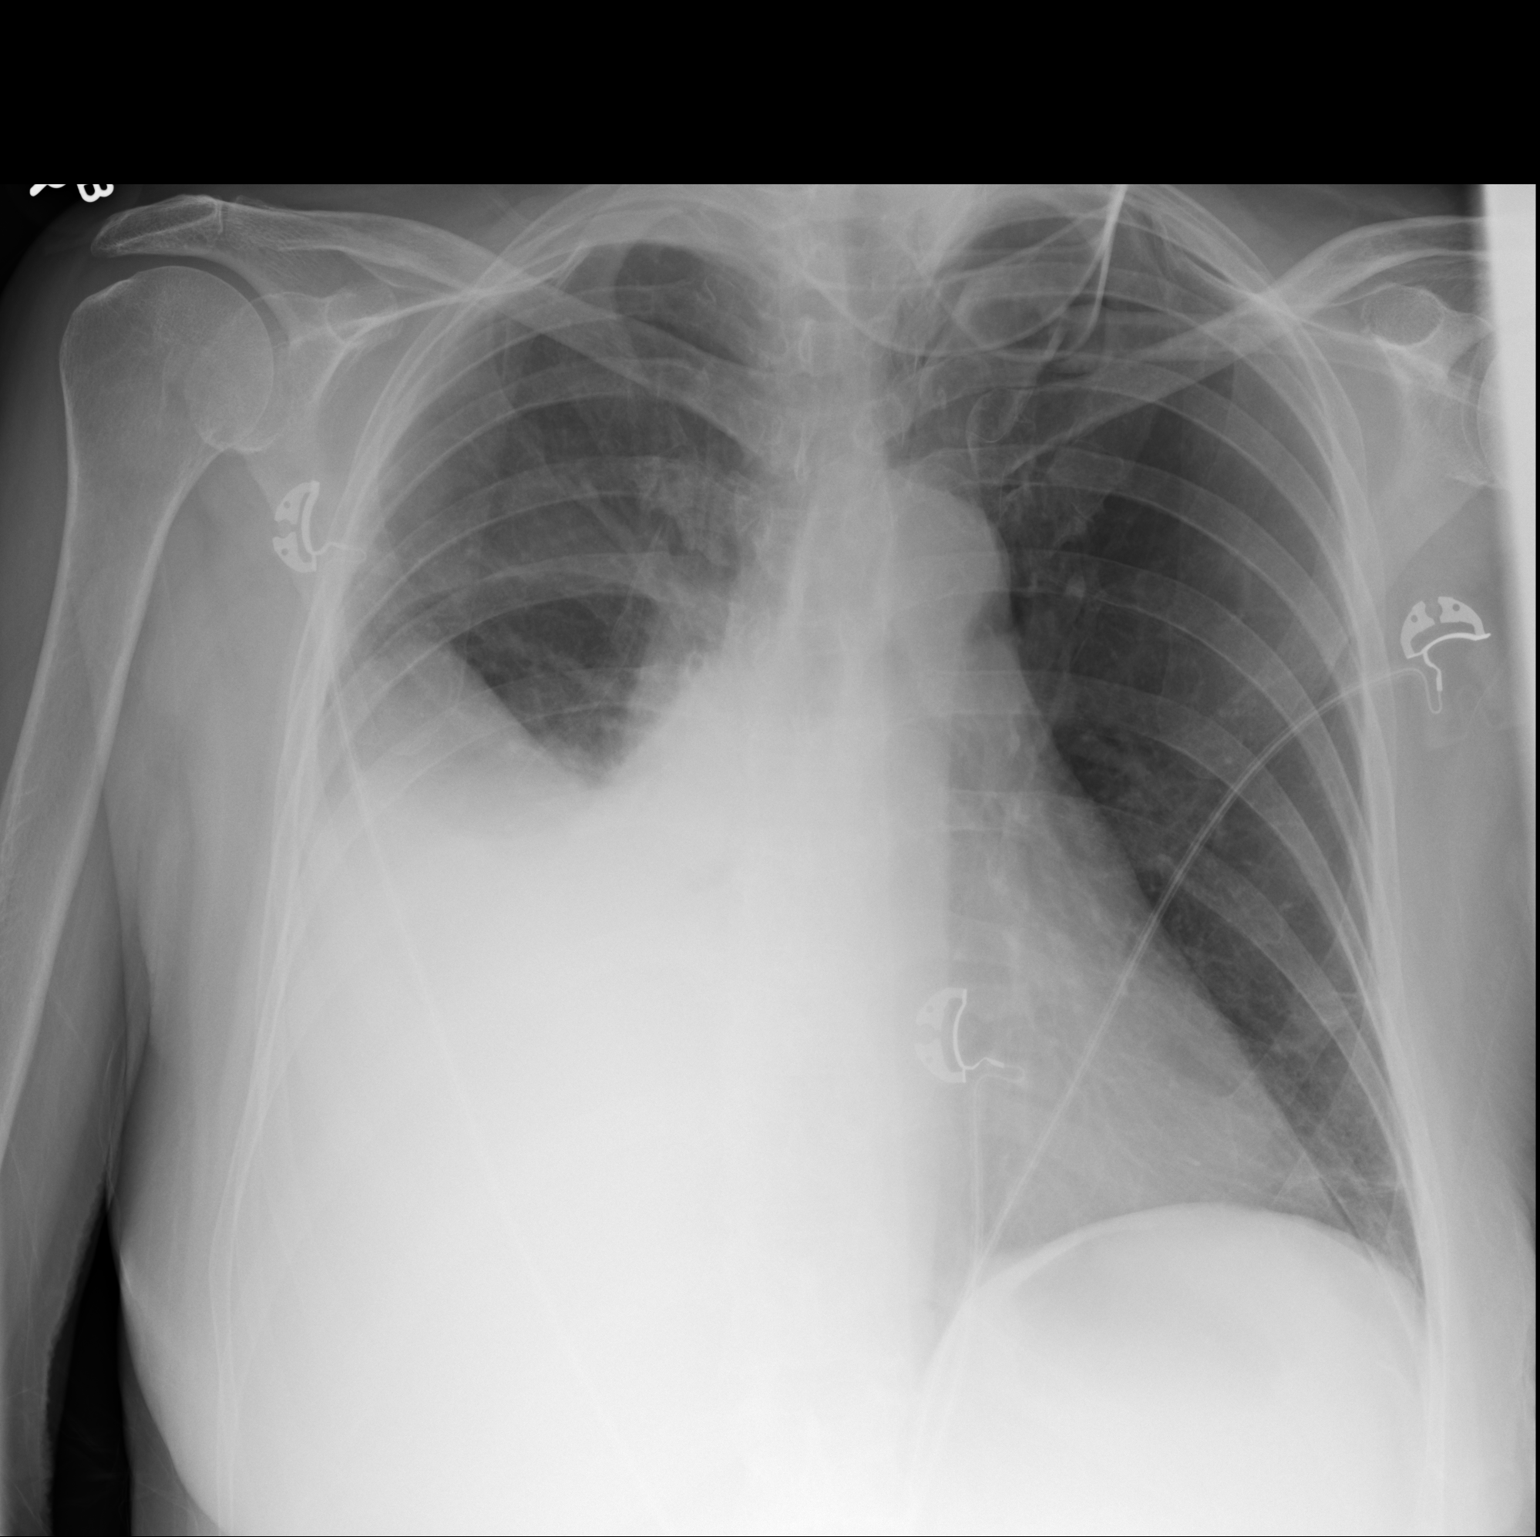

[1 of 1 positions shown; findings below may reference images not displayed]

FINDINGS: The right mid and lower chest is now opacified. Findings are
suggestive for a moderate to large sized right pleural effusion. Few
linear densities at left lung base are suggestive for atelectasis.
Otherwise, the left lung is clear. Heart size is stable.
IMPRESSION: Opacification throughout the mid and right lower chest. Findings are
most likely related to a moderate to large sized pleural effusion.
